# Patient Record
Sex: Female | Born: 1995
Health system: Southern US, Community
[De-identification: ages and names within clinical notes are randomized; demographics above are authoritative.]

## PROBLEM LIST (undated history)

## (undated) DIAGNOSIS — F329 Major depressive disorder, single episode, unspecified: Secondary | ICD-10-CM

## (undated) DIAGNOSIS — T7840XA Allergy, unspecified, initial encounter: Secondary | ICD-10-CM

## (undated) DIAGNOSIS — J45909 Unspecified asthma, uncomplicated: Secondary | ICD-10-CM

## (undated) DIAGNOSIS — B019 Varicella without complication: Secondary | ICD-10-CM

## (undated) DIAGNOSIS — F419 Anxiety disorder, unspecified: Secondary | ICD-10-CM

## (undated) DIAGNOSIS — Z8619 Personal history of other infectious and parasitic diseases: Secondary | ICD-10-CM

## (undated) DIAGNOSIS — F509 Eating disorder, unspecified: Secondary | ICD-10-CM

## (undated) DIAGNOSIS — F32A Depression, unspecified: Secondary | ICD-10-CM

## (undated) DIAGNOSIS — I639 Cerebral infarction, unspecified: Secondary | ICD-10-CM

## (undated) HISTORY — DX: Allergy, unspecified, initial encounter: T78.40XA

## (undated) HISTORY — PX: TONSILLECTOMY AND ADENOIDECTOMY: SUR1326

## (undated) HISTORY — DX: Varicella without complication: B01.9

## (undated) HISTORY — DX: Personal history of other infectious and parasitic diseases: Z86.19

## (undated) HISTORY — PX: WISDOM TOOTH EXTRACTION: SHX21

---

## 2016-02-19 ENCOUNTER — Encounter (HOSPITAL_COMMUNITY): Payer: Self-pay | Admitting: Emergency Medicine

## 2016-02-19 ENCOUNTER — Emergency Department (HOSPITAL_COMMUNITY)
Admission: EM | Admit: 2016-02-19 | Discharge: 2016-02-19 | Disposition: A | Discharge status: Home / Self Care | Payer: BC Managed Care – PPO | Attending: Emergency Medicine | Admitting: Emergency Medicine

## 2016-02-19 DIAGNOSIS — Z79899 Other long term (current) drug therapy: Secondary | ICD-10-CM | POA: Insufficient documentation

## 2016-02-19 DIAGNOSIS — J45909 Unspecified asthma, uncomplicated: Secondary | ICD-10-CM | POA: Insufficient documentation

## 2016-02-19 DIAGNOSIS — F32A Depression, unspecified: Secondary | ICD-10-CM

## 2016-02-19 DIAGNOSIS — F329 Major depressive disorder, single episode, unspecified: Secondary | ICD-10-CM | POA: Diagnosis not present

## 2016-02-19 DIAGNOSIS — Z046 Encounter for general psychiatric examination, requested by authority: Secondary | ICD-10-CM | POA: Diagnosis present

## 2016-02-19 DIAGNOSIS — F509 Eating disorder, unspecified: Secondary | ICD-10-CM | POA: Insufficient documentation

## 2016-02-19 HISTORY — DX: Cerebral infarction, unspecified: I63.9

## 2016-02-19 HISTORY — DX: Unspecified asthma, uncomplicated: J45.909

## 2016-02-19 HISTORY — DX: Eating disorder, unspecified: F50.9

## 2016-02-19 HISTORY — DX: Anxiety disorder, unspecified: F41.9

## 2016-02-19 HISTORY — DX: Depression, unspecified: F32.A

## 2016-02-19 HISTORY — DX: Major depressive disorder, single episode, unspecified: F32.9

## 2016-02-19 LAB — COMPREHENSIVE METABOLIC PANEL
ALBUMIN: 5.2 g/dL — AB (ref 3.5–5.0)
ALK PHOS: 51 U/L (ref 38–126)
ALT: 16 U/L (ref 14–54)
AST: 22 U/L (ref 15–41)
Anion gap: 8 (ref 5–15)
BILIRUBIN TOTAL: 1.1 mg/dL (ref 0.3–1.2)
BUN: 19 mg/dL (ref 6–20)
CALCIUM: 9.7 mg/dL (ref 8.9–10.3)
CO2: 25 mmol/L (ref 22–32)
CREATININE: 0.86 mg/dL (ref 0.44–1.00)
Chloride: 107 mmol/L (ref 101–111)
GFR calc Af Amer: >60 mL/min (ref >60)
GFR calc non Af Amer: >60 mL/min (ref >60)
GLUCOSE: 84 mg/dL (ref 65–99)
Potassium: 3.7 mmol/L (ref 3.5–5.1)
SODIUM: 140 mmol/L (ref 135–145)
Total Protein: 7.6 g/dL (ref 6.5–8.1)

## 2016-02-19 LAB — CBC WITH DIFFERENTIAL/PLATELET
Basophils Absolute: 0 10*3/uL (ref 0.0–0.1)
Basophils Relative: 0 %
Eosinophils Absolute: 0.4 10*3/uL (ref 0.0–0.7)
Eosinophils Relative: 5 %
HEMATOCRIT: 42.5 % (ref 36.0–46.0)
HEMOGLOBIN: 14.3 g/dL (ref 12.0–15.0)
LYMPHS ABS: 3.4 10*3/uL (ref 0.7–4.0)
LYMPHS PCT: 48 %
MCH: 31 pg (ref 26.0–34.0)
MCHC: 33.6 g/dL (ref 30.0–36.0)
MCV: 92.2 fL (ref 78.0–100.0)
Monocytes Absolute: 0.4 10*3/uL (ref 0.1–1.0)
Monocytes Relative: 5 %
NEUTROS ABS: 3.1 10*3/uL (ref 1.7–7.7)
NEUTROS PCT: 42 %
Platelets: 250 10*3/uL (ref 150–400)
RBC: 4.61 MIL/uL (ref 3.87–5.11)
RDW: 13.1 % (ref 11.5–15.5)
WBC: 7.2 10*3/uL (ref 4.0–10.5)

## 2016-02-19 LAB — SALICYLATE LEVEL: Salicylate Lvl: <7 mg/dL (ref 2.8–30.0)

## 2016-02-19 LAB — I-STAT BETA HCG BLOOD, ED (MC, WL, AP ONLY): I-stat hCG, quantitative: <5 m[IU]/mL (ref <5)

## 2016-02-19 LAB — ETHANOL: Alcohol, Ethyl (B): <5 mg/dL (ref <5)

## 2016-02-19 LAB — ACETAMINOPHEN LEVEL

## 2016-02-19 MED ORDER — ONDANSETRON HCL 4 MG PO TABS: 4.0000 mg | ORAL_TABLET | Freq: Three times a day (TID) | ORAL | Status: DC | PRN

## 2016-02-19 MED ORDER — ALUM & MAG HYDROXIDE-SIMETH 200-200-20 MG/5ML PO SUSP: 30.0000 mL | ORAL | Status: DC | PRN

## 2016-02-19 MED ORDER — ZOLPIDEM TARTRATE 5 MG PO TABS: 5.0000 mg | ORAL_TABLET | Freq: Every evening | ORAL | Status: DC | PRN

## 2016-02-19 MED ORDER — ACETAMINOPHEN 325 MG PO TABS: 650.0000 mg | ORAL_TABLET | ORAL | Status: DC | PRN

## 2016-02-19 MED ORDER — IBUPROFEN 200 MG PO TABS: 600.0000 mg | ORAL_TABLET | Freq: Three times a day (TID) | ORAL | Status: DC | PRN

## 2016-02-19 MED ORDER — LORAZEPAM 1 MG PO TABS: 1.0000 mg | ORAL_TABLET | Freq: Three times a day (TID) | ORAL | Status: DC | PRN

## 2016-02-19 NOTE — ED Triage Notes (Signed)
Per Mother pt has depression and anxiety and text her sister last night and told her she was going to overdose  Pt has hx of overdose at age 89 Mother states pt also has an eating disorder and has lost 75 lbs in the past 6 months  Pt was placed on Zoloft a week ago  Pt has been to a therapist for two visits and did not like it so quit going but has an appt on the 23rd  Mother states she filed an intake form with [employer] for her eating disorder but they do not offer any outpt program  Pt recently had a breakup with her boyfriend

## 2016-02-19 NOTE — BH Assessment (Addendum)
Tele Assessment Note   Amy Odom is an 20 y.o. female. Presenting voluntarily accompanied by mother and father for assessment. Pt has history of anorexia nervosa, anxiety, and depression. Pt reports anorexia is "in recovery". Pt does report recent weight loss of 75lbs within the last six months. Pt reports no menstrual cycle.   Pt and significant other of two years broke up approximately one month ago. Pt states "last night I found out that my ex-boyfriend was dating another girl and it just got me down". Pt communicated to sibling that she felt "impulsive" and "thought about overdosing" on Zoloft. Pt denies experiencing intent. Pt states "it was more like pay attention to me". Pt further explained that her mother had "blew me off" and she wanted her sister to relay the statement to mother so that she would "come pay attention to me". Pt attends [employer] and resides on campus with roommates.  Pt denies suicidal ideation at time of assessment. Parents are alarmed due to patients history of suicide attempt (20yo) via intentional ingestion of Dilantin which resulted in ICU. Pt denies any additional attempts. Pt has no history of inpatient admission.  Pt denies history of self-harm. Pt denies homicidal ideations. Pt denies hallucinations. Pt is not currently followed by any outpatient providers.  Pt's prescriptions are managed by her primary care provider.   Parents state patient is doing well in school however "she doesn't seem to find any happiness" and often seems "loss or hopeless". Mother states that she is able to ensure pt's safety in the home if discharged. Mother states pt would not be returning to campus dormitory if discharged. Father communicates he is less comfortable than mother in ability to ensure pt's safety. Pt is apprehensive of possibility of inpatient admission recommendation. .   Diagnosis: Major depressive disorder, recurrent, mild Anorexia Nervosa Generalized anxiety  disorder  Past Medical History:  Past Medical History:  Diagnosis Date  . Anxiety   . Asthma   . Depression   . Eating disorder   . Stroke [facility])     Past Surgical History:  Procedure Laterality Date  . TONSILLECTOMY AND ADENOIDECTOMY      Family History:  Family History  Problem Relation Age of Onset  . Depression Mother   . Anxiety disorder Mother   . Seizures Father   . Cancer Father     Social History:  reports that she has never smoked. She has never used smokeless tobacco. She reports that she does not drink alcohol or use drugs.  Additional Social History:  Alcohol / Drug Use Pain Medications: Pt denies abuse Prescriptions: Pt denies abuse. Pt reports compliance with medications History of alcohol / drug use?: No history of alcohol / drug abuse  CIWA: CIWA-Ar BP: 99/78 Pulse Rate: 76 COWS:    PATIENT STRENGTHS: (choose at least two) Average or above average intelligence Supportive family/friends  Allergies: No Known Allergies  Home Medications:  (Not in a hospital admission)  OB/GYN Status:  No LMP recorded. Patient is not currently having periods (Reason: Other).  General Assessment Data Location of Assessment: [facility] ED TTS Assessment: In system Is this a Tele or Face-to-Face Assessment?: Face-to-Face Is this an Initial Assessment or a Re-assessment for this encounter?: Initial Assessment Marital status: Single Is patient pregnant?: Unknown Pregnancy Status: Unknown Living Arrangements: Non-relatives/Friends (On Campus) Can pt return to current living arrangement?: Yes Admission Status: Voluntary Is patient capable of signing voluntary admission?: Yes Referral Source: Self/Family/Friend Insurance type: BCBS  Crisis Care Plan Living Arrangements: Non-relatives/Friends (On Campus) Name of Psychiatrist: None Name of Therapist: None  Education Status Is patient currently in school?: Yes Current Grade: [employer] Highest grade of school  patient has completed: Freshmen Name of [facility] [employer] Contact person: None Indentified  Risk to self with the past 6 months Suicidal Ideation: Yes-Currently Present Has patient been a risk to self within the past 6 months prior to admission? : No Suicidal Intent: No Has patient had any suicidal intent within the past 6 months prior to admission? : No Is patient at risk for suicide?: Yes Suicidal Plan?: No-Not Currently/Within Last 6 Months Has patient had any suicidal plan within the past 6 months prior to admission? : Yes (Overdosing on Zoloft) Access to Means: Yes Specify Access to Suicidal Means: Access to Zoloft What has been your use of drugs/alcohol within the last 12 months?: 4 Previous Attempts/Gestures: Yes How many times?: 1 (age 20) Other Self Harm Risks: h/o suicide attempt Triggers for Past Attempts: Other (Comment) (Depression) Intentional Self Injurious Behavior: None Family Suicide History: Yes (grandfather) Recent stressful life event(s): Other (Comment) (recovering from eating disorder) Persecutory voices/beliefs?: No Depression: Yes Depression Symptoms: Fatigue, Guilt (to eating disorder) Substance abuse history and/or treatment for substance abuse?: No Suicide prevention information given to non-admitted patients: Yes  Risk to Others within the past 6 months Homicidal Ideation: No Does patient have any lifetime risk of violence toward others beyond the six months prior to admission? : No Thoughts of Harm to Others: No Current Homicidal Intent: No Current Homicidal Plan: No Access to Homicidal Means: No History of harm to others?: No Assessment of Violence: None Noted Does patient have access to weapons?: No Criminal Charges Pending?: No Does patient have a court date: No Is patient on probation?: No  Psychosis Hallucinations: None noted Delusions: None noted  Mental Status Report Appearance/Hygiene: In scrubs Eye Contact: Good Motor Activity:  Unremarkable Speech: Soft, Logical/coherent Level of Consciousness: Alert, Quiet/awake Mood: Sad Affect: Constricted Anxiety Level: Minimal Thought Processes: Coherent, Relevant Judgement: Unimpaired Orientation: Person, Place, Time, Situation Obsessive Compulsive Thoughts/Behaviors: None  Cognitive Functioning Concentration: Normal Memory: Recent Intact, Remote Intact IQ: Average Insight: Good Impulse Control: Fair Appetite: Fair (Per pt report) Weight Loss: 75 (within last six months) Weight Gain: 0 Sleep: No Change Total Hours of Sleep: 7 Vegetative Symptoms: None  ADLScreening [facility]) Patient's cognitive ability adequate to safely complete daily activities?: Yes Patient able to express need for assistance with ADLs?: Yes Independently performs ADLs?: Yes (appropriate for developmental age)  Prior Inpatient Therapy Prior Inpatient Therapy: No  Prior Outpatient Therapy Prior Outpatient Therapy: Yes Prior Therapy Dates: 3 months ago Prior Therapy Facilty/Provider(s): [facility] Reason for Treatment: depression and eating disorder Does patient have an ACCT team?: No Does patient have Intensive In-House Services?  : No Does patient have [facility] services? : No Does patient have P4CC services?: No  ADL Screening (condition at time of admission) Patient's cognitive ability adequate to safely complete daily activities?: Yes Is the patient deaf or have difficulty hearing?: No Does the patient have difficulty concentrating, remembering, or making decisions?: No Patient able to express need for assistance with ADLs?: Yes Does the patient have difficulty dressing or bathing?: No Independently performs ADLs?: Yes (appropriate for developmental age) Does the patient have difficulty walking or climbing stairs?: No Weakness of Legs: None Weakness of Arms/Hands: None  Home Assistive Devices/Equipment Home Assistive Devices/Equipment:  None  Therapy Consults (therapy consults require a  physician order) PT Evaluation Needed: No OT Evalulation Needed: No SLP Evaluation Needed: No Abuse/Neglect Assessment (Assessment to be complete while patient is alone) Physical Abuse: Denies Verbal Abuse: Denies Sexual Abuse: Denies Exploitation of patient/patient's resources: Denies Self-Neglect: Denies Values / Beliefs Cultural Requests During Hospitalization: None Spiritual Requests During Hospitalization: None Consults Spiritual Care Consult Needed: No Social Work Consult Needed: No Merchant navy officer (For Healthcare) Does patient have an advance directive?: No Would patient like information on creating an advanced directive?: No - patient declined information    Additional Information 1:1 In Past 12 Months?: No CIRT Risk: No Elopement Risk: No Does patient have medical clearance?: No     Disposition: Clinician consulted with Donell Sievert, PA and pt is not recommended for inpatient admission. Clinician provided and discussed with pt and family intensive outpatient and OPT resources including [facility]. Clinician also provided pt and family with information for [facility] mobile crisis. Lanae Crumbly, PA informed of and agrees with pt disposition.  Disposition Initial Assessment Completed for this Encounter: Yes Disposition of Patient: Other dispositions Other disposition(s): Other (Comment) (Pending Psychiatric Recommendation)  Paxtyn Boyar J [place] 02/19/2016 8:47 PM

## 2016-02-19 NOTE — ED Notes (Signed)
Bed: WTR6 Expected date:  Expected time:  Means of arrival:  Comments: 

## 2016-02-19 NOTE — Discharge Instructions (Signed)
Refer to resource guide for further outpatient assistance. Return here for any new/worsening symptoms (ie-- suicidal or homicidal ideation, hallucinations, etc).

## 2016-02-19 NOTE — ED Provider Notes (Signed)
[facility]-EMERGENCY DEPT Provider Note   CSN: [id number] Arrival date & time: 02/19/16  1821     History   Chief Complaint Chief Complaint  Patient presents with  . Suicidal    HPI Amy Odom is a 20 y.o. female.  The history is provided by the patient and medical records.    20 year old female with history of anxiety, asthma, depression, anorexia nervosa, presenting to the ED for psychiatric evaluation. For the past 11 months or so patient has been battling with an eating disorder.  States notably this began last Christmas-- refused to eat any holiday treats or other "extras".  Mother states she has lost approximately 75 pounds in the past 6 months. Patient recently saw a counselor, however did not like the counselor so she has decided not to go back. She states now she feels that she is eating more and make sure that she gets "enough calories to sustain me". She continues to restrict her calories, will not eat anything without a label or number of calories visible. Patient does report she has been withdrawing from her friends and family because of this, mostly she is concerned about eating in public. States she does get some type of joy from knowing that she can control her caloric intake and her weight.  Apparently last night, she was upset over a recent breakup with her boyfriend after finding out he now has a new girlfriend and texted at her sister and said she was going to overdose on medications. States she did not take any medications. This concerned her mother as she has a history of overdose at the age of 20. She took an overdose of Dilantin and ended up intubated in the ICU for 1 week because of this. She was never admitted to a psychiatric facility following this.  Patient denies any current suicidal or homicidal ideation.  No auditory or visual hallucinations.  Denies recent drug or alcohol abuse.  Patient has been taking zoloft for her depression for the past week or so-- states not  sure if it is helping, is causing some nausea.    Past Medical History:  Diagnosis Date  . Anxiety   . Asthma   . Depression   . Eating disorder   . Stroke [facility](HCC)     There are no active problems to display for this patient.   Past Surgical History:  Procedure Laterality Date  . TONSILLECTOMY AND ADENOIDECTOMY      OB History    No data available       Home Medications    Prior to Admission medications   Medication Sig Start Date End Date Taking? Authorizing Provider  Multiple Vitamin (MULTIVITAMIN WITH MINERALS) TABS tablet Take 1 tablet by mouth daily.   Yes Historical Provider, MD  sertraline (ZOLOFT) 25 MG tablet Take 25 mg by mouth daily.   Yes Historical Provider, MD    Family History Family History  Problem Relation Age of Onset  . Depression Mother   . Anxiety disorder Mother   . Seizures Father   . Cancer Father     Social History Social History  Substance Use Topics  . Smoking status: Never Smoker  . Smokeless tobacco: Never Used  . Alcohol use No     Allergies   Review of patient's allergies indicates no known allergies.   Review of Systems Review of Systems  Psychiatric/Behavioral: Positive for behavioral problems.  All other systems reviewed and are negative.    Physical Exam Updated  Vital Signs BP 99/78 (BP Location: Left Arm)   Pulse 76   Temp 97.6 F (36.4 C) (Oral)   Resp 16   Ht 5\' 10"  (1.778 m)   Wt 56.2 kg   SpO2 99%   BMI 17.79 kg/m   Physical Exam  Constitutional: She is oriented to person, place, and time. She appears well-developed and well-nourished.  Thin but not cachectic  HENT:  Head: Normocephalic and atraumatic.  Mouth/Throat: Oropharynx is clear and moist.  Eyes: Conjunctivae and EOM are normal. Pupils are equal, round, and reactive to light.  Neck: Normal range of motion.  Cardiovascular: Normal rate, regular rhythm and normal heart sounds.   Pulmonary/Chest: Effort normal and breath sounds normal.    Abdominal: Soft. Bowel sounds are normal.  Musculoskeletal: Normal range of motion.  Neurological: She is alert and oriented to person, place, and time.  Skin: Skin is warm and dry.  Psychiatric: She has a normal mood and affect. She is not actively hallucinating. She expresses no homicidal and no suicidal ideation. She expresses no suicidal plans and no homicidal plans.  Denies SI/HI/AVH  Nursing note and vitals reviewed.    ED Treatments / Results  Labs (all labs ordered are listed, but only abnormal results are displayed) Labs Reviewed  COMPREHENSIVE METABOLIC PANEL - Abnormal; Notable for the following:       Result Value   Albumin 5.2 (*)    All other components within normal limits  ETHANOL  SALICYLATE LEVEL  ACETAMINOPHEN LEVEL  CBC WITH DIFFERENTIAL/PLATELET  RAPID URINE DRUG SCREEN, HOSP PERFORMED  I-STAT BETA HCG BLOOD, ED ([facility], [facility], [facility] ONLY)    EKG  EKG Interpretation None       Radiology No results found.  Procedures Procedures (including critical care time)  Medications Ordered in ED Medications - No data to display   Initial Impression / Assessment and Plan / ED Course  I have reviewed the triage vital signs and the nursing notes.  Pertinent labs & imaging results that were available during my care of the patient were reviewed by me and considered in my medical decision making (see chart for details).  Clinical Course   20 y.o. F here with mom for psychiatric evaluation.  She has known hx of depression as well as anorexia nervosa and told sister she wanted to OD on meds last night after discovering her ex boyfriend is now dating someone else.  Denies taking any pills or other medications last night.  No drugs or alcohol.  Denies feelings of SI/HI or AVH currently.  Does have hx of OD in the past.  Lab work reassuring.  TTS consult pending.  9:51 PM TTS has evaluated patient.  Feels she is safe for discharge with outpatient resources.  See their note  for full details.  Given resources regarding programs about eating disorders.  D/c home in care of mom with strict return precautions for any new/worsening symptoms-- specifically SI/HI/AVH.  Final Clinical Impressions(s) / ED Diagnoses   Final diagnoses:  Eating disorder  Depression, unspecified depression type    New Prescriptions Discharge Medication List as of 02/19/2016 10:33 PM       Garlon Hatchet, PA-C 02/19/16 2320    Alvira Monday, MD 02/24/16 1719

## 2016-03-04 ENCOUNTER — Emergency Department (HOSPITAL_COMMUNITY)
Admission: EM | Admit: 2016-03-04 | Discharge: 2016-03-04 | Disposition: A | Discharge status: Home / Self Care | Payer: BC Managed Care – PPO | Attending: Emergency Medicine | Admitting: Emergency Medicine

## 2016-03-04 ENCOUNTER — Encounter (HOSPITAL_COMMUNITY): Payer: Self-pay

## 2016-03-04 ENCOUNTER — Emergency Department (HOSPITAL_COMMUNITY): Payer: BC Managed Care – PPO

## 2016-03-04 DIAGNOSIS — F5001 Anorexia nervosa, restricting type: Secondary | ICD-10-CM | POA: Diagnosis not present

## 2016-03-04 DIAGNOSIS — R1033 Periumbilical pain: Secondary | ICD-10-CM | POA: Insufficient documentation

## 2016-03-04 DIAGNOSIS — Z8673 Personal history of transient ischemic attack (TIA), and cerebral infarction without residual deficits: Secondary | ICD-10-CM | POA: Diagnosis not present

## 2016-03-04 DIAGNOSIS — R42 Dizziness and giddiness: Secondary | ICD-10-CM | POA: Diagnosis present

## 2016-03-04 DIAGNOSIS — R001 Bradycardia, unspecified: Secondary | ICD-10-CM | POA: Diagnosis not present

## 2016-03-04 DIAGNOSIS — J45909 Unspecified asthma, uncomplicated: Secondary | ICD-10-CM | POA: Diagnosis not present

## 2016-03-04 DIAGNOSIS — R109 Unspecified abdominal pain: Secondary | ICD-10-CM

## 2016-03-04 LAB — URINALYSIS, ROUTINE W REFLEX MICROSCOPIC
Bilirubin Urine: NEGATIVE
Glucose, UA: NEGATIVE mg/dL
Hgb urine dipstick: NEGATIVE
Ketones, ur: NEGATIVE mg/dL
Leukocytes, UA: NEGATIVE
Nitrite: NEGATIVE
Protein, ur: NEGATIVE mg/dL
Specific Gravity, Urine: 1.018 (ref 1.005–1.030)
pH: 7.5 (ref 5.0–8.0)

## 2016-03-04 LAB — COMPREHENSIVE METABOLIC PANEL
ALT: 17 U/L (ref 14–54)
AST: 22 U/L (ref 15–41)
Albumin: 4.9 g/dL (ref 3.5–5.0)
Alkaline Phosphatase: 47 U/L (ref 38–126)
Anion gap: 6 (ref 5–15)
BUN: 17 mg/dL (ref 6–20)
CO2: 24 mmol/L (ref 22–32)
Calcium: 9.3 mg/dL (ref 8.9–10.3)
Chloride: 111 mmol/L (ref 101–111)
Creatinine, Ser: 0.83 mg/dL (ref 0.44–1.00)
GFR calc Af Amer: >60 mL/min (ref >60)
GFR calc non Af Amer: >60 mL/min (ref >60)
Glucose, Bld: 81 mg/dL (ref 65–99)
Potassium: 3.8 mmol/L (ref 3.5–5.1)
Sodium: 141 mmol/L (ref 135–145)
Total Bilirubin: 1.2 mg/dL (ref 0.3–1.2)
Total Protein: 7.1 g/dL (ref 6.5–8.1)

## 2016-03-04 LAB — LIPASE, BLOOD: Lipase: 44 U/L (ref 11–51)

## 2016-03-04 LAB — URINE MICROSCOPIC-ADD ON
Bacteria, UA: NONE SEEN
RBC / HPF: NONE SEEN RBC/hpf (ref 0–5)
SQUAMOUS EPITHELIAL / LPF: NONE SEEN
WBC UA: NONE SEEN WBC/hpf (ref 0–5)

## 2016-03-04 LAB — MAGNESIUM: Magnesium: 2.4 mg/dL (ref 1.7–2.4)

## 2016-03-04 LAB — CBC
HCT: 40.5 % (ref 36.0–46.0)
Hemoglobin: 13.7 g/dL (ref 12.0–15.0)
MCH: 31.4 pg (ref 26.0–34.0)
MCHC: 33.8 g/dL (ref 30.0–36.0)
MCV: 92.9 fL (ref 78.0–100.0)
Platelets: 227 10*3/uL (ref 150–400)
RBC: 4.36 MIL/uL (ref 3.87–5.11)
RDW: 13.2 % (ref 11.5–15.5)
WBC: 7.4 10*3/uL (ref 4.0–10.5)

## 2016-03-04 LAB — HCG, QUANTITATIVE, PREGNANCY: hCG, Beta Chain, Quant, S: <1 m[IU]/mL (ref <5)

## 2016-03-04 LAB — PHOSPHORUS: Phosphorus: 2.7 mg/dL (ref 2.5–4.6)

## 2016-03-04 MED ORDER — METOCLOPRAMIDE HCL 5 MG/ML IJ SOLN
5.0000 mg | Freq: Once | INTRAMUSCULAR | Status: AC
  Administered 2016-03-04: 5 mg via INTRAVENOUS
  Filled 2016-03-04: qty 2

## 2016-03-04 MED ORDER — DICYCLOMINE HCL 10 MG PO CAPS
10.0000 mg | ORAL_CAPSULE | Freq: Once | ORAL | Status: AC
  Administered 2016-03-04: 10 mg via ORAL
  Filled 2016-03-04: qty 1

## 2016-03-04 MED ORDER — SODIUM CHLORIDE 0.9 % IV BOLUS (SEPSIS)
1000.0000 mL | Freq: Once | INTRAVENOUS | Status: AC
  Administered 2016-03-04: 1000 mL via INTRAVENOUS

## 2016-03-04 MED ORDER — METOCLOPRAMIDE HCL 5 MG PO TABS: 2.5000 mg | ORAL_TABLET | Freq: Three times a day (TID) | ORAL | 0 refills | Status: DC | PRN

## 2016-03-04 NOTE — ED Notes (Signed)
[employer] at bedside

## 2016-03-04 NOTE — ED Provider Notes (Signed)
[facility]-EMERGENCY DEPT Provider Note   CSN: [id number] Arrival date & time: 03/04/16  1331     History   Chief Complaint Chief Complaint  Patient presents with  . Eating Disorder    HPI Amy Odom is a 20 y.o. female.  HPI   20yF with anorexia nervosa. Has been an issue from last ~1y. Last night after swim practice she felt generally weak. Lightheaded as if she may pass out. She binge ate because she assumed it was because she hasn't been eating well.  She began having significant epigastric/periumbilical pain shortly later. She felt full to the point that she felt like she needed to vomit. She did not try to self induce this. She reports intermittent abdominal pain for the past couple weeks which is worse post prandially. Associated nausea. COntinued to feel tired/weak today. Reports this to nutritionist she just established with and was advised to seek medical evaluation. She has also had one counseling session, but did not feel comfortable with counselor.   Past Medical History:  Diagnosis Date  . Anxiety   . Asthma   . Depression   . Eating disorder   . Stroke [facility])     There are no active problems to display for this patient.   Past Surgical History:  Procedure Laterality Date  . TONSILLECTOMY AND ADENOIDECTOMY      OB History    No data available       Home Medications    Prior to Admission medications   Medication Sig Start Date End Date Taking? Authorizing Provider  Multiple Vitamin (MULTIVITAMIN WITH MINERALS) TABS tablet Take 1 tablet by mouth daily.    Historical Provider, MD  sertraline (ZOLOFT) 25 MG tablet Take 25 mg by mouth daily.    Historical Provider, MD    Family History Family History  Problem Relation Age of Onset  . Depression Mother   . Anxiety disorder Mother   . Seizures Father   . Cancer Father     Social History Social History  Substance Use Topics  . Smoking status: Never Smoker  . Smokeless tobacco: Never Used  .  Alcohol use No     Allergies   Review of patient's allergies indicates no known allergies.   Review of Systems Review of Systems  All systems reviewed and negative, other than as noted in HPI.   Physical Exam Updated Vital Signs BP 107/63 (BP Location: Right Arm)   Pulse 84   Temp 97.5 F (36.4 C) (Oral)   Resp 22   Ht 5\' 10"  (1.778 m)   Wt 120 lb (54.4 kg)   SpO2 98%   BMI 17.22 kg/m   Physical Exam  Constitutional: She appears well-developed and well-nourished. No distress.  HENT:  Head: Normocephalic and atraumatic.  Eyes: Conjunctivae are normal. Right eye exhibits no discharge. Left eye exhibits no discharge.  Neck: Neck supple.  Cardiovascular: Normal rate, regular rhythm and normal heart sounds.  Exam reveals no gallop and no friction rub.   No murmur heard. Pulmonary/Chest: Effort normal and breath sounds normal. No respiratory distress.  Abdominal: Soft. She exhibits no distension. There is no tenderness.  Musculoskeletal: She exhibits no edema or tenderness.  Neurological: She is alert.  Skin: Skin is warm and dry.  Psychiatric: She has a normal mood and affect. Her behavior is normal. Thought content normal.  Nursing note and vitals reviewed.    ED Treatments / Results  Labs (all labs ordered are listed, but only abnormal  results are displayed) Labs Reviewed  URINALYSIS, ROUTINE W REFLEX MICROSCOPIC (NOT AT [facility]) - Abnormal; Notable for the following:       Result Value   APPearance TURBID (*)    All other components within normal limits  LIPASE, BLOOD  COMPREHENSIVE METABOLIC PANEL  CBC  HCG, QUANTITATIVE, PREGNANCY  PHOSPHORUS  MAGNESIUM  URINE MICROSCOPIC-ADD ON    EKG  EKG Interpretation None       Radiology US Abdomen Complete  Result Date: 03/04/2016 CLINICAL DATA:  Epigastric pain since this morning EXAM: ABDOMEN ULTRASOUND COMPLETE COMPARISON:  None. FINDINGS: Gallbladder: No gallstones or wall thickening visualized. No  sonographic Murphy sign noted by sonographer. Common bile duct: Diameter: 2.6 mm Liver: No focal lesion identified. Within normal limits in parenchymal echogenicity. IVC: No abnormality visualized. Pancreas: Visualized portion unremarkable. Spleen: Size and appearance within normal limits. Right Kidney: Length: 11.5 cm. Echogenicity within normal limits. No mass or hydronephrosis visualized. Left Kidney: Length: 11.1 cm. Echogenicity within normal limits. No mass or hydronephrosis visualized. Abdominal aorta: No aneurysm visualized. Other findings: None. IMPRESSION: No acute intra-abdominal pathology. Electronically Signed   By: Jolaine Click M.D.   On: 03/04/2016 16:57    Procedures Procedures (including critical care time)  Medications Ordered in ED Medications - No data to display   Initial Impression / Assessment and Plan / ED Course  I have reviewed the triage vital signs and the nursing notes.  Pertinent labs & imaging results that were available during my care of the patient were reviewed by me and considered in my medical decision making (see chart for details).  Clinical Course    20yF with anorexia nervosa. Near syncopal symptoms. This could be from a multitude of different reasons. Family is concerned about refeeding syndrome although clinically I doubt this.   She is bradycardic. Bradycardia is common findings in anorexic patients and is typically compensatory from chronically reduced caloric intake. She could be potentially symptomatic from it. Somewhat reassuring though that she doesn't seem to be symptomatic with exertion such as when she swims. Also consider significant electrolyte derangement. Her QRS is somewhat widened at 111 ms, but intervals are otherwise ok. Hypovolemia/dehydration could be contributing.   Abdominal pain could be from several different reasons as well. I suspect it's probably from  gastroparesis with hx of anorexia and she reports symptoms worsened shortly  after eating, feeling of bloating, fullness, etc. Gastritis, PUD, pancreatitis and biliary colic are all also considerations but I feel they are less likely.  This is just not a situation which is ideally addressed in the ED. We will certainly check her electrolytes. Will obtain RUQ [place] to evaluate her GB. I can certainly start her empirically on a H2 blocker for possible dyspepsia or metoclopramide for possible gastroparesis.  I think seeing a nutritionist is a good idea, but I think counseling is the primary intervention here. Parents are understandably frustrated. Unless someone is acutely psychotic, suicidal or homicidal the ER is just not adequate to address this. I can certainly give her a list of counseling resources, but I'm not sure which would be best specifically for eating disorders.   5:22 PM US unremarkable. Labs normal. Discussed with psych NP for names of some outpt providers who specifically do work with people with eating disorders. Will also proscribe low dose of reglan to try PRN particularly before meals. It has been determined that no acute conditions requiring further emergency intervention are present at this time. The patient has been advised  of the diagnosis and plan. I reviewed any labs and imaging including any potential incidental findings. We have discussed signs and symptoms that warrant return to the ED and they are listed in the discharge instructions.    Final Clinical Impressions(s) / ED Diagnoses   Final diagnoses:  Anorexia nervosa, restricting type  Abdominal pain, unspecified abdominal location  Bradycardia    New Prescriptions New Prescriptions   No medications on file     Raeford RazorStephen Dorsel Flinn, MD 03/04/16 1724

## 2016-03-04 NOTE — ED Notes (Signed)
PT AND FAMILY MADE AWARE OF RHE NEED FOR A URINE SAMPLE.

## 2016-03-04 NOTE — ED Triage Notes (Signed)
PT BROUGHT IN BY PARENTS FOR ANOREXIA RELATED SYMPTOMS. PT SUFFERS FROM ANOREXIA BUT ALSO HAD AN EPISODE OF BINGE EATING LAST NIGHT. PT WAS SEEN BY A DOCTOR TODAY AND THEY WERE TOLD TO BRING HER IN FOR IMMEDIATE LAB WORK. PT C/O ABDOMINAL PAIN.

## 2016-09-05 ENCOUNTER — Ambulatory Visit (HOSPITAL_COMMUNITY)
Admission: EM | Admit: 2016-09-05 | Discharge: 2016-09-05 | Disposition: A | Discharge status: Nursing Home (Includes ICF) | Payer: 59

## 2016-09-05 ENCOUNTER — Encounter (HOSPITAL_COMMUNITY): Payer: Self-pay | Admitting: Family Medicine
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
## 2017-01-19 DIAGNOSIS — N3000 Acute cystitis without hematuria: Secondary | ICD-10-CM | POA: Diagnosis not present

## 2017-01-20 ENCOUNTER — Encounter: Payer: Self-pay | Admitting: Family Medicine

## 2017-01-20 ENCOUNTER — Ambulatory Visit (INDEPENDENT_AMBULATORY_CARE_PROVIDER_SITE_OTHER): Payer: 59 | Admitting: Family Medicine

## 2017-01-20 VITALS — BP 118/70 | HR 68 | Resp 12 | Ht 70.02 in | Wt 160.5 lb

## 2017-01-20 DIAGNOSIS — F5000 Anorexia nervosa, unspecified: Secondary | ICD-10-CM | POA: Diagnosis not present

## 2017-01-20 DIAGNOSIS — R41840 Attention and concentration deficit: Secondary | ICD-10-CM

## 2017-01-20 DIAGNOSIS — F339 Major depressive disorder, recurrent, unspecified: Secondary | ICD-10-CM | POA: Diagnosis not present

## 2017-01-20 MED ORDER — BUPROPION HCL ER (SR) 100 MG PO TB12: 100.0000 mg | ORAL_TABLET | Freq: Two times a day (BID) | ORAL | 0 refills | Status: DC

## 2017-01-20 NOTE — Progress Notes (Signed)
HPI:   [name] is a 21 y.o. female, who is here today to establish care.  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 Last preventive routine visit: 1-2 years ago.  Chronic medical problems: Depression, anorexia,secondary amenorrhea. Asthma during childhood, seasonal allergies.   Concerns today: she is requesting a prescription for Adderall to help with concentration.  She denies history of ADHD or ADD. She is reporting "concentration problems" for about 2 years. States that she has trouble paying attention at school, completing her homework, and keeping a conversation in a social setting. She doesn't think her depression is causing these symptoms.  She sleeps "fine", about 7-8 hours.  She last followed with psychiatrist about 6 months ago, she was supposed to follow but she did not schedule appointment. She didn't discussed and concentration issues during psychiatric visit. Currently she is not taking antidepressant medication. She tried Lexapro about 2 years ago but discontinued because it didn't help. She has history of suicidal attempts, hospitalized in 2012.  According to patient, her anorexia is not longer an active problem. Started gaining wt 6-7 months ago and her weight has been stable for the past 4 months. She exercises about 4-5 times per week: 30 minutes of cardio and 30 minutes of weightlifting.  LMP 2.5 years ago.  Reporting lab work done about 5-6 months ago. She is sexually active and uses condoms consistently.  She lives with her parents.   Review of Systems  Constitutional: Negative for activity change, appetite change, fatigue and unexpected weight change.  HENT: Negative for mouth sores, nosebleeds, sore throat and trouble swallowing.   Eyes: Negative for redness and visual disturbance.  Respiratory: Negative for chest tightness, shortness of breath and wheezing.   Cardiovascular: Negative for chest pain, palpitations and leg swelling.    Gastrointestinal: Negative for abdominal pain, nausea and vomiting.       No changes in bowel habits.  Endocrine: Negative for cold intolerance and heat intolerance.  Genitourinary: Positive for menstrual problem. Negative for decreased urine volume, dysuria and vaginal bleeding.  Musculoskeletal: Negative for gait problem and myalgias.  Skin: Negative for rash.  Allergic/Immunologic: Positive for environmental allergies.  Neurological: Negative for dizziness, syncope, weakness and headaches.  Hematological: Negative for adenopathy. Does not bruise/bleed easily.  Psychiatric/Behavioral: Positive for decreased concentration. Negative for confusion, hallucinations, self-injury, sleep disturbance and suicidal ideas. The patient is not nervous/anxious.      No current outpatient prescriptions on file prior to visit.   No current facility-administered medications on file prior to visit.      Past Medical History:  Diagnosis Date  . Allergy   . Anxiety   . Asthma   . Chicken pox   . Depression   . Eating disorder   . Stroke [facility])    Related to head trauma at age 26   No Known Allergies  Family History  Problem Relation Age of Onset  . Depression Mother   . Anxiety disorder Mother   . Alcohol abuse Mother   . Seizures Father   . Cancer Father   . Cancer Maternal Uncle        Lung  . Alcohol abuse Maternal Grandmother   . Cancer Maternal Grandmother        colon    Social History   Social History  . Marital status: Single    Spouse name: N/A  . Number of children: N/A  . Years of education: N/A   Social History Main  Topics  . Smoking status: Never Smoker  . Smokeless tobacco: Never Used  . Alcohol use Yes     Comment: seldom  . Drug use: No  . Sexual activity: Yes    Birth control/ protection: Condom   Other Topics Concern  . None   Social History Narrative  . None    Vitals:   01/20/17 1131  BP: 118/70  Pulse: 68  Resp: 12  SpO2: 98%    Body  mass index is 23.02 kg/m.   Physical Exam  Nursing note and vitals reviewed. Constitutional: She is oriented to person, place, and time. She appears well-developed and well-nourished. No distress.  HENT:  Head: Normocephalic and atraumatic.  Mouth/Throat: Oropharynx is clear and moist and mucous membranes are normal.  Eyes: Pupils are equal, round, and reactive to light. Conjunctivae and EOM are normal.  Neck: No tracheal deviation present. No thyroid mass and no thyromegaly present.  Cardiovascular: Normal rate and regular rhythm.   No murmur heard. Pulses:      Dorsalis pedis pulses are 2+ on the right side, and 2+ on the left side.  Respiratory: Effort normal and breath sounds normal. No respiratory distress.  GI: Soft. She exhibits no mass. There is no hepatomegaly. There is no tenderness.  Musculoskeletal: She exhibits no edema.  Lymphadenopathy:    She has no cervical adenopathy.  Neurological: She is alert and oriented to person, place, and time. She has normal strength. Coordination and gait normal.  Skin: Skin is warm. No erythema.  Psychiatric:  Flat affect. Well groomed, poor eye contact.    ASSESSMENT AND PLAN:   Amy Odom was seen today for establish care.  Diagnoses and all orders for this visit:  Disturbed concentration  We discussed possible causes, including other psychiatric disorders like depression or anxiety. We also discussed some of the treatment options and diagnostic criteria for ADD/ADHD. I am strongly recommending following with her psychiatrist, who can evaluate and treat accordingly. Explained that my concern about medications as Adderall it that could exacerbate wt loss.  She agrees with trying Wellbutrin, we discussed side effects.  -     buPROPion (WELLBUTRIN SR) 100 MG 12 hr tablet; Take 1 tablet (100 mg total) by mouth 2 (two) times daily.  Recurrent major depressive disorder, remission status unspecified (HCC)  This is still an active  problem and can be causing/aggravating her concentration problems. I strongly recommend scheduling a follow-up appointment with her psychiatrist. We discussed some side effects of Wellbutrin, including suicidal thoughts and wt loss given her history. I will see her back in 4 weeks if she cannot is scheduled an appointment with her psychiatrist by then.  -     buPROPion (WELLBUTRIN SR) 100 MG 12 hr tablet; Take 1 tablet (100 mg total) by mouth 2 (two) times daily.  Anorexia nervosa in remission  She is reporting problem is well controlled. She is not doing psychotherapy currently. She will continue following with psychiatrist.     Jannae Fagerstrom G. [place], MD  [facility]. [place] office.

## 2017-01-20 NOTE — Patient Instructions (Signed)
A few things to remember from today's visit:   Disturbed concentration - Plan: buPROPion (WELLBUTRIN SR) 100 MG 12 hr tablet  Recurrent major depressive disorder, remission status unspecified (HCC)  Please follow with your psychiatrist. If you cannot see your provider within the next 4 weeks, I will see her back in 4 weeks. If any suicidal thoughts, stop medication and seek immediate medical attention. Caution with this medication, it can decrease appetite.  Please be sure medication list is accurate. If a new problem present, please set up appointment sooner than planned today.

## 2017-02-20 ENCOUNTER — Ambulatory Visit: Payer: 59 | Admitting: Family Medicine

## 2017-04-07 ENCOUNTER — Ambulatory Visit: Payer: Self-pay | Admitting: *Deleted

## 2017-04-07 ENCOUNTER — Telehealth: Payer: Self-pay | Admitting: Family Medicine

## 2017-04-07 NOTE — Telephone Encounter (Signed)
[facility], and is complaining of burning with urination and blood noticed in urine; pt is also experiencing chills;pt triaged per nurse protocol; pt states that she "unable to be seen today because she has class and  requests to be seen tomorrow 04/08/17 at 1230 because she has work in the morning"; [facility]; pt offered and agreed to appointment at 0845 04/08/17 with Dr Clent RidgesFry; pt verbalizes understanding; will route to [facility] pool to notify them of upcoming appointment; see CRM # [id number].  Reason for Disposition . Blood in urine (red, pink, or tea-colored)  Answer Assessment - Initial Assessment Questions 1. SEVERITY: "How bad is the pain?"  (e.g., Scale 1-10; mild, moderate, or severe)   - MILD (1-3): complains slightly about urination hurting   - MODERATE (4-7): interferes with normal activities     - SEVERE (8-10): excruciating, unwilling or unable to urinate because of the pain      Moderate 7 out of 10 2. FREQUENCY: "How many times have you had painful urination today?"      Everytime; voided 4 times today 3. PATTERN: "Is pain present every time you urinate or just sometimes?"      yes 4. ONSET: "When did the painful urination start?"      04/05/17 5. FEVER: "Do you have a fever?" If so, ask: "What is your temperature, how was it measured, and when did it start?"     No temp has not been taken 6. PAST UTI: "Have you had a urine infection before?" If so, ask: "When was the last time?" and "What happened that time?"      Yes; last UTI 6-7 months ago 7. CAUSE: "What do you think is causing the painful urination?"  (e.g., UTI, scratch, Herpes sore)     UTI 8. OTHER SYMPTOMS: "Do you have any other symptoms?" (e.g., flank pain, vaginal discharge, genital sores, urgency, blood in urine)     Blood in urine, chills 9. PREGNANCY: "Is there any chance you are pregnant?" "When was your last menstrual period?"     No, LMP  middle of November 2018  Protocols used: URINATION PAIN [facility]- FEMALE-A-AH

## 2017-04-07 NOTE — Telephone Encounter (Signed)
Copied from CRM [phone]. Topic: Inquiry >> Apr 07, 2017 10:39 AM Windy KalataMichael, Taylor L, NT wrote: Reason for CRM: pt is calling and is requesting a antibiotic sent in to her pharmacy for a UTI.

## 2017-04-07 NOTE — Telephone Encounter (Signed)
Please schedule acute visit appt for urinary symptoms.  Thanks, BJ

## 2017-04-08 ENCOUNTER — Encounter: Payer: Self-pay | Admitting: Family Medicine

## 2017-04-08 ENCOUNTER — Ambulatory Visit (INDEPENDENT_AMBULATORY_CARE_PROVIDER_SITE_OTHER): Payer: 59 | Admitting: Family Medicine

## 2017-04-08 ENCOUNTER — Ambulatory Visit: Payer: 59 | Admitting: Family Medicine

## 2017-04-08 VITALS — BP 110/78 | HR 68 | Temp 97.7°F | Ht 70.02 in | Wt 162.4 lb

## 2017-04-08 DIAGNOSIS — R319 Hematuria, unspecified: Secondary | ICD-10-CM

## 2017-04-08 DIAGNOSIS — N390 Urinary tract infection, site not specified: Secondary | ICD-10-CM

## 2017-04-08 DIAGNOSIS — R350 Frequency of micturition: Secondary | ICD-10-CM

## 2017-04-08 LAB — POCT URINALYSIS DIPSTICK
BILIRUBIN UA: NEGATIVE
GLUCOSE UA: NEGATIVE
Ketones, UA: NEGATIVE
NITRITE UA: NEGATIVE
Protein, UA: NEGATIVE
Spec Grav, UA: 1.01 (ref 1.010–1.025)
Urobilinogen, UA: 0.2 E.U./dL
pH, UA: 7.5 (ref 5.0–8.0)

## 2017-04-08 MED ORDER — NITROFURANTOIN MONOHYD MACRO 100 MG PO CAPS: 100.0000 mg | ORAL_CAPSULE | Freq: Two times a day (BID) | ORAL | 0 refills | Status: AC

## 2017-04-08 NOTE — Telephone Encounter (Signed)
Patient has an appointment today at 4pm  

## 2017-04-08 NOTE — Progress Notes (Signed)
HPI:  Chief Complaint  Patient presents with  . Dysuria  . Hematuria    2 DAYS AGO  . Fatigue    Amy Odom is a 21 y.o. female, who is here today complaining of 2 of urinary symptoms.   Dysuria: Yes,burning sensation. Urinary frequency: Yes Urinary urgency: Yes Incontinence: Denies Gross hematuria: one episode when symptoms just started. She denies history of nephrolithiasis, she is not sure about prior episodes of gross hematuria. Abdominal pain: Denies   Nausea or vomiting: Denies  Abnormal vaginal bleeding or discharge: Denies  LMP: 03/26/17 Sexual activity: Yes, no more than usual. Hx of UTI: Yes, last UTI about 2-3 months ago. A day before urinary symptoms onset, she "held urine all day", she thinks this contributed to UTI symptoms.  She has been evaluated by urologist. According to patient she was evaluated about 2 months ago and work-up was "fine."  UTIs seem to be exacerbated by sexual intercourse.  OTC medications for this problem: Cranberry pills.   Review of Systems  Constitutional: Positive for chills and fatigue. Negative for activity change, appetite change and fever.  Cardiovascular: Negative for leg swelling.  Gastrointestinal: Negative for abdominal pain, nausea and vomiting.       No changes in bowel habits.  Genitourinary: Positive for dysuria, frequency, hematuria and urgency. Negative for decreased urine volume, vaginal bleeding and vaginal discharge.  Musculoskeletal: Negative for back pain and myalgias.  Hematological: Negative for adenopathy. Does not bruise/bleed easily.  Psychiatric/Behavioral: Negative for confusion. The patient is nervous/anxious.       No current outpatient medications on file prior to visit.   No current facility-administered medications on file prior to visit.      Past Medical History:  Diagnosis Date  . Allergy   . Anxiety   . Asthma   . Chicken pox   . Depression   . Eating disorder     . Stroke [facility](HCC)    Related to head trauma at age 89   No Known Allergies  Social History   Socioeconomic History  . Marital status: Single    Spouse name: None  . Number of children: None  . Years of education: None  . Highest education level: None  Social Needs  . Financial resource strain: None  . Food insecurity - worry: None  . Food insecurity - inability: None  . Transportation needs - medical: None  . Transportation needs - non-medical: None  Occupational History  . None  Tobacco Use  . Smoking status: Never Smoker  . Smokeless tobacco: Never Used  Substance and Sexual Activity  . Alcohol use: Yes    Comment: seldom  . Drug use: No  . Sexual activity: Yes    Birth control/protection: Condom  Other Topics Concern  . None  Social History Narrative  . None    Vitals:   04/08/17 1609  BP: 110/78  Pulse: 68  Temp: 97.7 F (36.5 C)  SpO2: 100%   Body mass index is 23.29 kg/m.   Physical Exam  Nursing note and vitals reviewed. Constitutional: She is oriented to person, place, and time. She appears well-developed. No distress.  HENT:  Head: Normocephalic and atraumatic.  Mouth/Throat: Oropharynx is clear and moist and mucous membranes are normal.  Eyes: Conjunctivae are normal.  Cardiovascular: Normal rate and regular rhythm.  Respiratory: Effort normal and breath sounds normal. No respiratory distress.  GI: Soft. She exhibits no mass. There is no tenderness. There is  no CVA tenderness.  Musculoskeletal: She exhibits no edema.  Neurological: She is alert and oriented to person, place, and time. She has normal strength. Gait normal.  Skin: Skin is warm. No erythema.  Psychiatric: Her mood appears anxious.  Well-groomed, good eye contact.    ASSESSMENT AND PLAN:   Amy Odom was seen today for dysuria, hematuria and fatigue.  Diagnoses and all orders for this visit:  Frequent urination  Urine dipstick today abnormal. Urine sent for culture.  -      POCT urinalysis dipstick -     Urine culture  Urinary tract infection with hematuria, site unspecified  Empiric abx treatment started today and will be tailored according to Ucx results and susceptibility report.  Clearly instructed about warning signs. F/U if symptoms persist.  We may need to consider prophylactic nitrofurantoin after sexual intercourse.   -     Urine culture -     nitrofurantoin, macrocrystal-monohydrate, (MACROBID) 100 MG capsule; Take 1 capsule (100 mg total) by mouth 2 (two) times daily for 5 days.    -Amy Flatterynna L Klima was advised to return or notify a doctor immediately if symptoms worsen or persist or new concerns arise.      Betty G. [place], MD  [facility]. [place] office.

## 2017-04-08 NOTE — Telephone Encounter (Signed)
Pt called back yesterday and cancelled her appt

## 2017-04-08 NOTE — Patient Instructions (Addendum)
A few things to remember from today's visit:   Frequent urination - Plan: POCT urinalysis dipstick, Urine culture  Urinary tract infection with hematuria, site unspecified - Plan: Urine culture    Adequate fluid intake, avoid holding urine for long hours, and over the counter Vit C OR cranberry capsules might help.  Today we will treat empirically with antibiotic, which we might need to change when urine culture comes back depending of bacteria susceptibility.  Seek immediate medical attention if severe abdominal pain, vomiting, fever/chills, or worsening symptoms. F/U if symptomatic are not any better after 2-3 days of antibiotic treatment.  Please be sure medication list is accurate. If a new problem present, please set up appointment sooner than planned today.

## 2017-04-09 LAB — URINE CULTURE
MICRO NUMBER:: 81336503
RESULT: NO GROWTH
SPECIMEN QUALITY:: ADEQUATE

## 2017-09-15 ENCOUNTER — Ambulatory Visit (INDEPENDENT_AMBULATORY_CARE_PROVIDER_SITE_OTHER): Payer: No Typology Code available for payment source | Admitting: Family Medicine

## 2017-09-15 ENCOUNTER — Encounter: Payer: Self-pay | Admitting: *Deleted

## 2017-09-15 ENCOUNTER — Encounter: Payer: Self-pay | Admitting: Family Medicine

## 2017-09-15 VITALS — BP 110/78 | HR 100 | Temp 97.7°F | Resp 12 | Ht 70.02 in | Wt 173.0 lb

## 2017-09-15 DIAGNOSIS — H6980 Other specified disorders of Eustachian tube, unspecified ear: Secondary | ICD-10-CM | POA: Diagnosis not present

## 2017-09-15 DIAGNOSIS — J4520 Mild intermittent asthma, uncomplicated: Secondary | ICD-10-CM | POA: Diagnosis not present

## 2017-09-15 DIAGNOSIS — J069 Acute upper respiratory infection, unspecified: Secondary | ICD-10-CM | POA: Diagnosis not present

## 2017-09-15 MED ORDER — BENZONATATE 100 MG PO CAPS: 200.0000 mg | ORAL_CAPSULE | Freq: Two times a day (BID) | ORAL | 0 refills | Status: AC | PRN

## 2017-09-15 MED ORDER — FLUTICASONE PROPIONATE 50 MCG/ACT NA SUSP: 1.0000 | Freq: Two times a day (BID) | NASAL | 3 refills | Status: DC

## 2017-09-15 MED ORDER — PREDNISONE 20 MG PO TABS: 40.0000 mg | ORAL_TABLET | Freq: Every day | ORAL | 0 refills | Status: AC

## 2017-09-15 MED ORDER — ALBUTEROL SULFATE 108 (90 BASE) MCG/ACT IN AEPB: 2.0000 | INHALATION_SPRAY | Freq: Four times a day (QID) | RESPIRATORY_TRACT | 0 refills | Status: DC | PRN
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
## 2017-09-15 NOTE — Patient Instructions (Addendum)
A few things to remember from today's visit:   URI, acute - Plan: benzonatate (TESSALON) 100 MG capsule, fluticasone (FLONASE) 50 MCG/ACT nasal spray  Mild intermittent reactive airway disease without complication - Plan: predniSONE (DELTASONE) 20 MG tablet, Albuterol Sulfate (PROAIR RESPICLICK) 108 (90 Base) MCG/ACT AEPB  Dysfunction of Eustachian tube, unspecified laterality viral infections are self-limited and we treat each symptom depending of severity.  Over the counter medications as decongestants and cold medications usually help, they need to be taken with caution if there is a history of high blood pressure or palpitations. Tylenol and/or Ibuprofen also helps with most symptoms (headache, muscle aching, fever,etc) Plenty of fluids. Honey helps with cough. Steam inhalations helps with runny nose, nasal congestion, and may prevent sinus infections. Cough and nasal congestion could last a few days and sometimes weeks.   Please follow in not any better in 1-2 weeks or if symptoms get worse.  Nasal irrigation with saline may also help.  Take prednisone with breakfast.  Albuterol inh 2 puff every 6 hours for a week then as needed for wheezing or shortness of breath.   Please be sure medication list is accurate. If a new problem present, please set up appointment sooner than planned today.

## 2017-11-04 ENCOUNTER — Other Ambulatory Visit (HOSPITAL_COMMUNITY)
Admission: RE | Admit: 2017-11-04 | Discharge: 2017-11-04 | Disposition: A | Discharge status: Home / Self Care | Payer: No Typology Code available for payment source | Source: Ambulatory Visit | Attending: Family Medicine | Admitting: Family Medicine

## 2017-11-04 ENCOUNTER — Encounter: Payer: Self-pay | Admitting: Family Medicine

## 2017-11-04 ENCOUNTER — Ambulatory Visit (INDEPENDENT_AMBULATORY_CARE_PROVIDER_SITE_OTHER): Payer: No Typology Code available for payment source | Admitting: Family Medicine

## 2017-11-04 VITALS — BP 112/76 | HR 74 | Temp 98.0°F | Resp 12 | Ht 70.02 in | Wt 178.0 lb

## 2017-11-04 DIAGNOSIS — Z113 Encounter for screening for infections with a predominantly sexual mode of transmission: Secondary | ICD-10-CM

## 2017-11-04 DIAGNOSIS — Z0000 Encounter for general adult medical examination without abnormal findings: Secondary | ICD-10-CM

## 2017-11-04 DIAGNOSIS — A749 Chlamydial infection, unspecified: Secondary | ICD-10-CM | POA: Insufficient documentation

## 2017-11-04 DIAGNOSIS — Z23 Encounter for immunization: Secondary | ICD-10-CM

## 2017-11-04 DIAGNOSIS — F339 Major depressive disorder, recurrent, unspecified: Secondary | ICD-10-CM

## 2017-11-04 NOTE — Patient Instructions (Addendum)
A few things to remember from today's visit:   Screening for STD (sexually transmitted disease) - Plan: Urine cytology ancillary only  Routine general medical examination at a health care facility  Today you have you routine preventive visit.  At least 150 minutes of moderate exercise per week, daily brisk walking for 15-30 min is a good exercise option. Healthy diet low in saturated (animal) fats and sweets and consisting of fresh fruits and vegetables, lean meats such as fish and white chicken and whole grains.  These are some of recommendations for screening depending of age and risk factors:   - Vaccines:  Tdap vaccine every 10 years. Given today.  Shingles vaccine recommended at age 21, could be given after 21 years of age but not sure about insurance coverage.   Pneumonia vaccines:  Prevnar 13 at 65 and Pneumovax at 66. Sometimes Pneumovax is giving earlier if history of smoking, lung disease,diabetes,kidney disease among some.    Screening for diabetes at age 21 and every 3 years.  Cervical cancer prevention:  Pap smear starts at 21 years of age and continues periodically until 21 years old in low risk women. Pap smear every 3 years between 1021 and 21 years old. Pap smear every 3-5 years between women 30 and older if pap smear negative and HPV screening negative.   -Breast cancer: Mammogram: There is disagreement between experts about when to start screening in low risk asymptomatic female but recent recommendations are to start screening at 3540 and not later than 21 years old , every 1-2 years and after 21 yo q 2 years. Screening is recommended until 21 years old but some women can continue screening depending of healthy issues.   Colon cancer screening: starts at 21 years old until 21 years old.  Cholesterol disorder screening at age 21 and every 3 years.  Also recommended:  1. Dental visit- Brush and floss your teeth twice daily; visit your dentist twice a  year. 2. Eye doctor- Get an eye exam at least every 2 years. 3. Helmet use- Always wear a helmet when riding a bicycle, motorcycle, rollerblading or skateboarding. 4. Safe sex- If you may be exposed to sexually transmitted infections, use a condom. 5. Seat belts- Seat belts can save your live; always wear one. 6. Smoke/Carbon Monoxide detectors- These detectors need to be installed on the appropriate level of your home. Replace batteries at least once a year. 7. Skin cancer- When out in the sun please cover up and use sunscreen 15 SPF or higher. 8. Violence- If anyone is threatening or hurting you, please tell your healthcare provider.  9. Drink alcohol in moderation- Limit alcohol intake to one drink or less per day. Never drink and drive.  Please be sure medication list is accurate. If a new problem present, please set up appointment sooner than planned today.

## 2017-11-04 NOTE — Assessment & Plan Note (Signed)
She does not think pharmacologic treatment is needed. She will continue following with psychotherapist q 2 weeks.

## 2017-11-04 NOTE — Progress Notes (Signed)
HPI:   Amy Odom is a 22 y.o. female, who is here today for her routine physical.  Last CPE: 2 years ago.  Regular exercise 3 or more time per week: 4 times per week. She rans 30 min on treadmill and 40 min strength exercises.  Following a healthy diet: Yes. She lives with her parents.  Chronic medical problems: Anxiety and past Hx of anorexia nervosa. She is currently following with psychologist.   Pap smear : Reporting pap smear a year ago,negative. Hx of STD's: Denies.  Sexually active. M:15 G:0 LMP: 10/24/17.  Condoms.   There is no immunization history on file for this patient.   She has no concerns today.     Review of Systems  Constitutional: Negative for appetite change, fatigue, fever and unexpected weight change.  HENT: Negative for dental problem, hearing loss, nosebleeds, trouble swallowing and voice change.   Eyes: Negative for redness and visual disturbance.  Respiratory: Negative for cough, shortness of breath and wheezing.   Cardiovascular: Negative for chest pain and leg swelling.  Gastrointestinal: Negative for abdominal pain, blood in stool, nausea and vomiting.       No changes in bowel habits.  Endocrine: Negative for cold intolerance, heat intolerance, polydipsia, polyphagia and polyuria.  Genitourinary: Negative for decreased urine volume, dysuria, hematuria, menstrual problem, vaginal bleeding and vaginal discharge.       No breast tenderness or nipple discharge.  Musculoskeletal: Negative for gait problem and myalgias.  Skin: Negative for rash.  Allergic/Immunologic: Positive for environmental allergies.  Neurological: Negative for syncope, weakness and headaches.  Hematological: Negative for adenopathy. Does not bruise/bleed easily.  Psychiatric/Behavioral: Negative for confusion, sleep disturbance and suicidal ideas. The patient is not nervous/anxious.   All other systems reviewed and are negative.     Current  Outpatient Medications on File Prior to Visit  Medication Sig Dispense Refill  . Albuterol Sulfate (PROAIR RESPICLICK) 108 (90 Base) MCG/ACT AEPB Inhale 2 puffs into the lungs 4 (four) times daily as needed. (Patient not taking: Reported on 11/04/2017) 1 each 0  . fluticasone (FLONASE) 50 MCG/ACT nasal spray Place 1 spray into both nostrils 2 (two) times daily. (Patient not taking: Reported on 11/04/2017) 16 g 3   No current facility-administered medications on file prior to visit.      Past Medical History:  Diagnosis Date  . Allergy   . Anxiety   . Asthma   . Chicken pox   . Depression   . Eating disorder   . Stroke [facility](HCC)    Related to head trauma at age 89    Past Surgical History:  Procedure Laterality Date  . TONSILLECTOMY AND ADENOIDECTOMY      No Known Allergies  Family History  Problem Relation Age of Onset  . Depression Mother   . Anxiety disorder Mother   . Alcohol abuse Mother   . Seizures Father   . Cancer Father   . Cancer Maternal Uncle        Lung  . Alcohol abuse Maternal Grandmother   . Cancer Maternal Grandmother        colon    Social History   Socioeconomic History  . Marital status: Single    Spouse name: Not on file  . Number of children: Not on file  . Years of education: Not on file  . Highest education level: Not on file  Occupational History  . Not on file  Social Needs  . Financial  resource strain: Not on file  . Food insecurity:    Worry: Not on file    Inability: Not on file  . Transportation needs:    Medical: Not on file    Non-medical: Not on file  Tobacco Use  . Smoking status: Never Smoker  . Smokeless tobacco: Never Used  Substance and Sexual Activity  . Alcohol use: Yes    Comment: seldom  . Drug use: No  . Sexual activity: Yes    Birth control/protection: Condom  Lifestyle  . Physical activity:    Days per week: Not on file    Minutes per session: Not on file  . Stress: Not on file  Relationships  . Social  connections:    Talks on phone: Not on file    Gets together: Not on file    Attends religious service: Not on file    Active member of club or organization: Not on file    Attends meetings of clubs or organizations: Not on file    Relationship status: Not on file  Other Topics Concern  . Not on file  Social History Narrative  . Not on file     Vitals:   11/04/17 1429  BP: 112/76  Pulse: 74  Resp: 12  Temp: 98 F (36.7 C)  SpO2: 99%   Body mass index is 25.53 kg/m.   Wt Readings from Last 3 Encounters:  11/04/17 178 lb (80.7 kg)  09/15/17 173 lb (78.5 kg)  04/08/17 162 lb 6 oz (73.7 kg)    Physical Exam  Nursing note and vitals reviewed. Constitutional: She is oriented to person, place, and time. She appears well-developed and well-nourished. No distress.  HENT:  Head: Normocephalic and atraumatic.  Right Ear: Hearing, tympanic membrane, external ear and ear canal normal.  Left Ear: Hearing, tympanic membrane, external ear and ear canal normal.  Mouth/Throat: Uvula is midline, oropharynx is clear and moist and mucous membranes are normal.  Eyes: Pupils are equal, round, and reactive to light. Conjunctivae and EOM are normal.  Neck: No tracheal deviation present. No thyromegaly present.  Cardiovascular: Normal rate and regular rhythm.  No murmur heard. Pulses:      Dorsalis pedis pulses are 2+ on the right side, and 2+ on the left side.  Respiratory: Effort normal and breath sounds normal. No respiratory distress.  GI: Soft. She exhibits no mass. There is no hepatomegaly. There is no tenderness.  Musculoskeletal: She exhibits no edema.  No major deformity or signs of synovitis appreciated.  Lymphadenopathy:    She has no cervical adenopathy.       Right: No supraclavicular adenopathy present.       Left: No supraclavicular adenopathy present.  Neurological: She is alert and oriented to person, place, and time. She has normal strength. No cranial nerve deficit.  Coordination and gait normal.  Reflex Scores:      Bicep reflexes are 2+ on the right side and 2+ on the left side.      Patellar reflexes are 2+ on the right side and 2+ on the left side. Skin: Skin is warm. No rash noted. No erythema.  Psychiatric: She has a normal mood and affect. Her speech is normal.  Well groomed, good eye contact.      ASSESSMENT AND PLAN:   Amy Odom was here today annual physical examination.    Orders Placed This Encounter  Procedures  . Tdap vaccine greater than or equal to 7yo IM  Routine general medical examination at a health care facility  We discussed the importance of regular physical activity and healthy diet for prevention of chronic illness and/or complications. Preventive guidelines reviewed. Vaccination updated.  Next CPE in 1-3 years.  Screening for STD (sexually transmitted disease) -     Urine cytology ancillary only  Need for Tdap vaccination -     Tdap vaccine greater than or equal to 7yo IM   Depression, major, recurrent (HCC) She does not think pharmacologic treatment is needed. She will continue following with psychotherapist q 2 weeks.     Return in 1 year (on 11/05/2018) for CPE.     Safia Panzer G. [place], MD  [facility]. [place] office.

## 2017-11-06 ENCOUNTER — Other Ambulatory Visit: Payer: Self-pay | Admitting: Family Medicine

## 2017-11-06 DIAGNOSIS — A5602 Chlamydial vulvovaginitis: Secondary | ICD-10-CM

## 2017-11-06 LAB — URINE CYTOLOGY ANCILLARY ONLY
Chlamydia: POSITIVE — AB
Neisseria Gonorrhea: NEGATIVE
Trichomonas: NEGATIVE

## 2017-11-06 MED ORDER — AZITHROMYCIN 500 MG PO TABS: 1000.0000 mg | ORAL_TABLET | Freq: Once | ORAL | 1 refills | Status: AC

## 2017-11-09 ENCOUNTER — Encounter: Payer: Self-pay | Admitting: Family Medicine

## 2017-11-09 ENCOUNTER — Ambulatory Visit (INDEPENDENT_AMBULATORY_CARE_PROVIDER_SITE_OTHER): Payer: No Typology Code available for payment source | Admitting: Family Medicine

## 2017-11-09 VITALS — BP 120/70 | HR 75 | Temp 97.8°F | Resp 12 | Ht 70.02 in | Wt 177.5 lb

## 2017-11-09 DIAGNOSIS — N938 Other specified abnormal uterine and vaginal bleeding: Secondary | ICD-10-CM

## 2017-11-09 DIAGNOSIS — A562 Chlamydial infection of genitourinary tract, unspecified: Secondary | ICD-10-CM | POA: Diagnosis not present

## 2017-11-09 DIAGNOSIS — Z114 Encounter for screening for human immunodeficiency virus [HIV]: Secondary | ICD-10-CM

## 2017-11-09 LAB — TSH: TSH: 2.4 u[IU]/mL (ref 0.35–5.50)

## 2017-11-09 NOTE — Progress Notes (Signed)
HPI:  Chief Complaint  Patient presents with  . Discuss labs    wants HIV testing    Amy Odom is a 22 y.o. female, who is here today to discuss recent lab results.   She was seen on 11/04/17 for her CPE. Urine cytology was positive for chlamydia. She has not taken Azithromycin.  She has been sexually active with same sex partner for 6 months. No prior Hx of STI's.  States that she is not having abnormal vaginal discharge but has had intermittent spotting for a few weeks. LMP 10/24/17. Negative for fever,abdominal pain,nausea,and vomiting. Hx of urine symptoms, she wonders if chlamydia may be causing recurrent dysuria. Last Ucx 03/2017 no growth.   She also would like HIV test done.   Review of Systems  Constitutional: Negative for chills and fever.  Gastrointestinal: Negative for abdominal pain, nausea and vomiting.  Genitourinary: Positive for vaginal bleeding. Negative for dyspareunia, dysuria, genital sores, vaginal discharge and vaginal pain.  Musculoskeletal: Negative for arthralgias and joint swelling.  Skin: Negative for rash.      Current Outpatient Medications on File Prior to Visit  Medication Sig Dispense Refill  . Albuterol Sulfate (PROAIR RESPICLICK) 108 (90 Base) MCG/ACT AEPB Inhale 2 puffs into the lungs 4 (four) times daily as needed. (Patient not taking: Reported on 11/04/2017) 1 each 0  . fluticasone (FLONASE) 50 MCG/ACT nasal spray Place 1 spray into both nostrils 2 (two) times daily. (Patient not taking: Reported on 11/04/2017) 16 g 3   No current facility-administered medications on file prior to visit.      Past Medical History:  Diagnosis Date  . Allergy   . Anxiety   . Asthma   . Chicken pox   . Depression   . Eating disorder   . Stroke [facility](HCC)    Related to head trauma at age 89   No Known Allergies  Social History   Socioeconomic History  . Marital status: Single    Spouse name: Not on file  . Number of children:  Not on file  . Years of education: Not on file  . Highest education level: Not on file  Occupational History  . Not on file  Social Needs  . Financial resource strain: Not on file  . Food insecurity:    Worry: Not on file    Inability: Not on file  . Transportation needs:    Medical: Not on file    Non-medical: Not on file  Tobacco Use  . Smoking status: Never Smoker  . Smokeless tobacco: Never Used  Substance and Sexual Activity  . Alcohol use: Yes    Comment: seldom  . Drug use: No  . Sexual activity: Yes    Birth control/protection: Condom  Lifestyle  . Physical activity:    Days per week: Not on file    Minutes per session: Not on file  . Stress: Not on file  Relationships  . Social connections:    Talks on phone: Not on file    Gets together: Not on file    Attends religious service: Not on file    Active member of club or organization: Not on file    Attends meetings of clubs or organizations: Not on file    Relationship status: Not on file  Other Topics Concern  . Not on file  Social History Narrative  . Not on file    Vitals:   11/09/17 1027  BP: 120/70  Pulse:  75  Resp: 12  Temp: 97.8 F (36.6 C)  SpO2: 100%   Body mass index is 25.45 kg/m.    Physical Exam  Nursing note and vitals reviewed. Constitutional: She is oriented to person, place, and time. She appears well-developed. No distress.  HENT:  Head: Normocephalic and atraumatic.  Eyes: Conjunctivae are normal.  Respiratory: Effort normal. No respiratory distress.  Musculoskeletal: She exhibits no edema.  Neurological: She is alert and oriented to person, place, and time. She has normal strength. Gait normal.  Skin: Skin is warm. No erythema.  Psychiatric: She has a normal mood and affect.  Good eye contact,well groomed.    ASSESSMENT AND PLAN:  Amy Odom was seen today for discuss labs.  Diagnoses and all orders for this visit:  Lab Results  Component Value Date   TSH 2.40  11/09/2017    DUB (dysfunctional uterine bleeding)  Possible etiologies discussed. If problem is persistent we need to consider OCP/IUD.  -     TSH  Encounter for screening for HIV -     HIV antibody  Chlamydial infection of GU tract  We discussed symptoms and treat,ment options. She is to start Azithromycin 1 g once and refill is available for her boyfriend. Strongly recommended condom use for STD prevention.      [name] G. [place], MD  [facility]. [place] office.

## 2017-11-09 NOTE — Patient Instructions (Signed)
A few things to remember from today's visit:   Encounter for screening for HIV - Plan: HIV antibody   Chlamydia, Female Chlamydia is an STD (sexually transmitted disease). This is an infection that spreads through sexual contact. If it is not treated, it can cause serious problems. It must be treated with antibiotic medicine. Sometimes, you may not have symptoms (asymptomatic). When you have symptoms, they can include:  Burning when you pee (urinate).  Peeing often.  Fluid (discharge) coming from the vagina.  Redness, soreness, and swelling (inflammation) of the butt (rectum).  Bleeding or fluid coming from the butt.  Belly (abdominal) pain.  Pain during sex.  Bleeding between periods.  Itching, burning, or redness in the eyes.  Fluid coming from the eyes.  Follow these instructions at home: Medicines  Take over-the-counter and prescription medicines only as told by your doctor.  Take your antibiotic medicine as told by your doctor. Do not stop taking the antibiotic even if you start to feel better. Sexual activity  Tell sex partners about your infection. Sex partners are people you had oral, anal, or vaginal sex with within 60 days of when you started getting sick. They need treatment, too.  Do not have sex until: ? You and your sex partners have been treated. ? Your doctor says it is okay.  If you have a single dose treatment, wait 7 days before having sex. General instructions  It is up to you to get your test results. Ask your doctor when your results will be ready.  Get a lot of rest.  Eat healthy foods.  Drink enough fluid to keep your pee (urine) clear or pale yellow.  Keep all follow-up visits as told by your doctor. You may need tests after 3 months. Preventing chlamydia  The only way to prevent chlamydia is not to have sex. To lower your risk: ? Use latex condoms correctly. Do this every time you have sex. ? Avoid having many sex partners. ? Ask  if your partner has been tested for STDs and if he or she had negative results. Contact a doctor if:  You get new symptoms.  You do not get better with treatment.  You have a fever or chills.  You have pain during sex. Get help right away if:  Your pain gets worse and does not get better with medicine.  You get flu-like symptoms, such as: ? Night sweats. ? Sore throat. ? Muscle aches.  You feel sick to your stomach (nauseous).  You throw up (vomit).  You have trouble swallowing.  You have bleeding: ? Between periods. ? After sex.  You have irregular periods.  You have belly pain that does not get better with medicine.  You have lower back pain that does not get better with medicine.  You feel weak or dizzy.  You pass out (faint).  You are pregnant and you get symptoms of chlamydia. Summary  Chlamydia is an infection that spreads through sexual contact.  Sometimes, chlamydia can cause no symptoms (asymptomatic).  Do not have sex until your doctor says it is okay.  All sex partners will have to be treated for chlamydia. This information is not intended to replace advice given to you by your health care provider. Make sure you discuss any questions you have with your health care provider. Document Released: 02/05/2008 Document Revised: 04/17/2016 Document Reviewed: 04/17/2016 Elsevier Interactive Patient Education  2017 [employer]vier Inc.  Please be sure medication list is accurate. If a new  problem present, please set up appointment sooner than planned today.

## 2017-11-10 ENCOUNTER — Encounter: Payer: Self-pay | Admitting: Family Medicine

## 2017-11-10 LAB — HIV ANTIBODY (ROUTINE TESTING W REFLEX): HIV 1&2 Ab, 4th Generation: NONREACTIVE

## 2018-01-01 ENCOUNTER — Encounter: Payer: Self-pay | Admitting: Family Medicine

## 2018-01-01 ENCOUNTER — Other Ambulatory Visit (HOSPITAL_COMMUNITY)
Admission: RE | Admit: 2018-01-01 | Discharge: 2018-01-01 | Disposition: A | Discharge status: Home / Self Care | Payer: No Typology Code available for payment source | Source: Ambulatory Visit | Attending: Family Medicine | Admitting: Family Medicine

## 2018-01-01 ENCOUNTER — Ambulatory Visit (INDEPENDENT_AMBULATORY_CARE_PROVIDER_SITE_OTHER): Payer: No Typology Code available for payment source | Admitting: Family Medicine

## 2018-01-01 VITALS — BP 120/80 | HR 86 | Temp 98.5°F | Resp 12 | Ht 70.02 in | Wt 179.2 lb

## 2018-01-01 DIAGNOSIS — Z8619 Personal history of other infectious and parasitic diseases: Secondary | ICD-10-CM

## 2018-01-01 NOTE — Patient Instructions (Signed)
A few things to remember from today's visit:   No diagnosis found.   Please be sure medication list is accurate. If a new problem present, please set up appointment sooner than planned today.         

## 2018-01-01 NOTE — Progress Notes (Signed)
      ACUTE VISIT   HPI:  Chief Complaint  Patient presents with  . STI Check    Amy Odom is a 22 y.o. female, who is here today to be recheck for chlamydia infection. She took azithromycin 1 g on 11/05/2017. She collected sample of urine and left. She did not report symptoms.    Review of Systems    No current outpatient medications on file prior to visit.   No current facility-administered medications on file prior to visit.      Past Medical History:  Diagnosis Date  . Allergy   . Anxiety   . Asthma   . Chicken pox   . Depression   . Eating disorder   . Stroke [facility](HCC)    Related to head trauma at age 89   Allergies  Allergen Reactions  . Other     sneezing    Social History   Socioeconomic History  . Marital status: Single    Spouse name: Not on file  . Number of children: Not on file  . Years of education: Not on file  . Highest education level: Not on file  Occupational History  . Not on file  Social Needs  . Financial resource strain: Not on file  . Food insecurity:    Worry: Not on file    Inability: Not on file  . Transportation needs:    Medical: Not on file    Non-medical: Not on file  Tobacco Use  . Smoking status: Never Smoker  . Smokeless tobacco: Never Used  Substance and Sexual Activity  . Alcohol use: Yes    Comment: seldom  . Drug use: No  . Sexual activity: Yes    Birth control/protection: Condom  Lifestyle  . Physical activity:    Days per week: Not on file    Minutes per session: Not on file  . Stress: Not on file  Relationships  . Social connections:    Talks on phone: Not on file    Gets together: Not on file    Attends religious service: Not on file    Active member of club or organization: Not on file    Attends meetings of clubs or organizations: Not on file    Relationship status: Not on file  Other Topics Concern  . Not on file  Social History Narrative  . Not on file    Vitals:   01/01/18  1134  BP: 120/80  Pulse: 86  Resp: 12  Temp: 98.5 F (36.9 C)  SpO2: 99%   Body mass index is 25.71 kg/m.    Physical Exam    ASSESSMENT AND PLAN:   Amy Odom was seen today for sti check.  Diagnoses and all orders for this visit:  Hx of chlamydia infection -     Urine cytology ancillary only      Carlester Kasparek G. [place], MD  [facility]. [place] office.

## 2018-01-04 ENCOUNTER — Encounter: Payer: Self-pay | Admitting: Family Medicine

## 2018-01-04 LAB — URINE CYTOLOGY ANCILLARY ONLY: Chlamydia: NEGATIVE

## 2018-04-06 ENCOUNTER — Telehealth: Payer: No Typology Code available for payment source | Admitting: Family Medicine

## 2018-04-06 DIAGNOSIS — N390 Urinary tract infection, site not specified: Secondary | ICD-10-CM | POA: Diagnosis not present

## 2018-04-06 MED ORDER — CEPHALEXIN 500 MG PO CAPS: 500.0000 mg | ORAL_CAPSULE | Freq: Two times a day (BID) | ORAL | 0 refills | Status: AC

## 2018-04-06 NOTE — Progress Notes (Signed)

## 2018-08-13 ENCOUNTER — Encounter: Payer: Self-pay | Admitting: Family Medicine

## 2018-08-13 ENCOUNTER — Telehealth: Payer: No Typology Code available for payment source | Admitting: Family

## 2018-08-13 DIAGNOSIS — J4530 Mild persistent asthma, uncomplicated: Secondary | ICD-10-CM | POA: Diagnosis not present

## 2018-08-13 MED ORDER — PREDNISONE 20 MG PO TABS: 40.0000 mg | ORAL_TABLET | Freq: Every day | ORAL | 0 refills | Status: DC

## 2018-08-13 NOTE — Progress Notes (Signed)
E Visit for Asthma  Based on what you have shared with me, it looks like you may have a flare up of your asthma.  Asthma is a chronic (ongoing) lung disease which results in airway obstruction, inflammation and hyper-responsiveness.   Asthma symptoms vary from person to person, with common symptoms including nighttime awakening and decreased ability to participate in normal activities as a result of shortness of breath. It is often triggered by changes in weather, changes in the season, changes in air temperature, or inside (home, school, daycare or work) allergens such as animal dander, mold, mildew, woodstoves or cockroaches.   It can also be triggered by hormonal changes, extreme emotion, physical exertion or an upper respiratory tract illness.     It is important to identify the trigger, and then eliminate or avoid the trigger if possible.   If you have been prescribed medications to be taken on a regular basis, it is important to follow the asthma action plan and to follow guidelines to adjust medication in response to increasing symptoms of decreased peak expiratory flow rate  Treatment: I have prescribed: Prednisone 40mg by mouth per day for 5 - 7 days  HOME CARE . Only take medications as instructed by your medical team. . Consider wearing a mask or scarf to improve breathing air temperature have been shown to decrease irritation and decrease exacerbations . Get rest. . Taking a steamy shower or using a humidifier may help nasal congestion sand ease sore throat pain. You can place a towel over your head and breathe in the steam from hot water coming from a faucet. . Using a saline nasal spray works much the same way.  . Cough drops, hare candies and sore throat lozenges may ease your cough.  . Avoid close contacts especially the very you and the elderly . Cover your mouth if  you cough or sneeze . Always remember to wash your hands.    GET HELP RIGHT AWAY IF: . You develop worsening symptoms; breathlessness at rest, drowsy, confused or agitated, unable to speak in full sentences . You have coughing fits . You develop a severe headache or visual changes . You develop shortness of breath, difficulty breathing or start having chest pain . Your symptoms persist after you have completed your treatment plan . If your symptoms do not improve within 10 days  MAKE SURE YOU . Understand these instructions. . Will watch your condition. . Will get help right away if you are not doing well or get worse.   Your e-visit answers were reviewed by a board certified advanced clinical practitioner to complete your personal care plan, Depending upon the condition, your plan could have included both over the counter or prescription medications.  Please review your pharmacy choice. Your safety is important to us. If you have drug allergies check your prescription carefully. You can use MyChart to ask questions about today's visit, request a non-urgent call back, or ask for a work or school excuse for 24 hours related to this e-Visit. If it has been greater than 24 hours you will need to follow up with your provider, or enter a new e-Visit to address those concerns.  You will get an e-mail in the next two days asking about your experience. I hope that your e-visit has been valuable and will speed your recovery. Thank you for using e-visits. 

## 2018-08-16 IMAGING — US US ABDOMEN COMPLETE
1 series · 14 of 25 positions shown · non-contrast
Comparison: None.

CLINICAL DATA: Epigastric pain since this morning

EXAM:
ABDOMEN ULTRASOUND COMPLETE

[Series 1: us abdomen complete · 0.19mm/px · 14 of 109 slices shown]
[im 1/109]
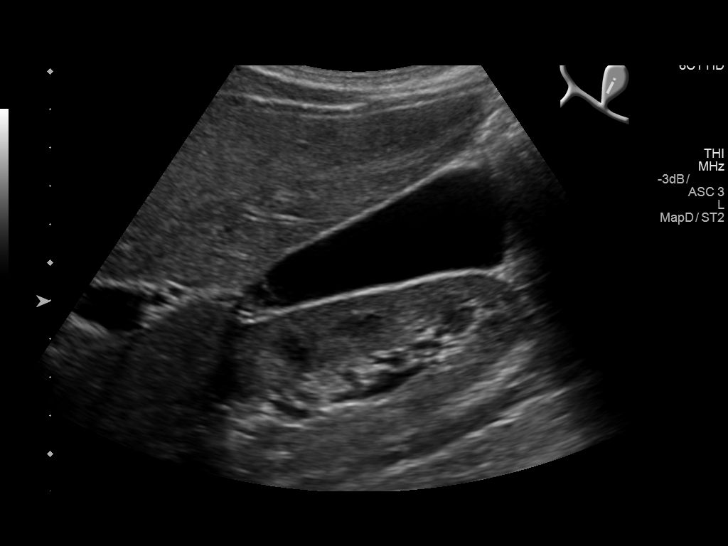
[im 10/109]
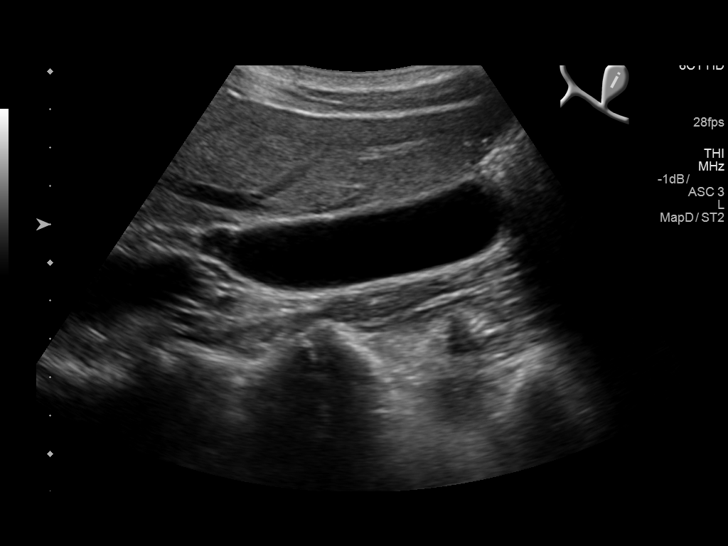
[im 19/109]
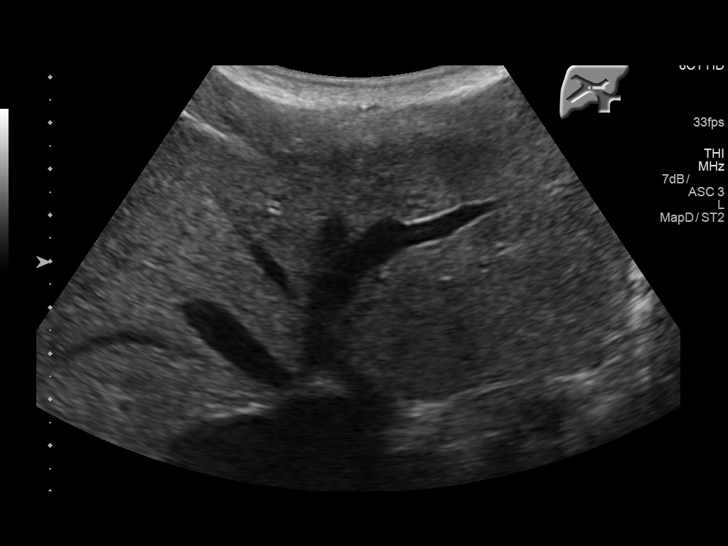
[im 28/109]
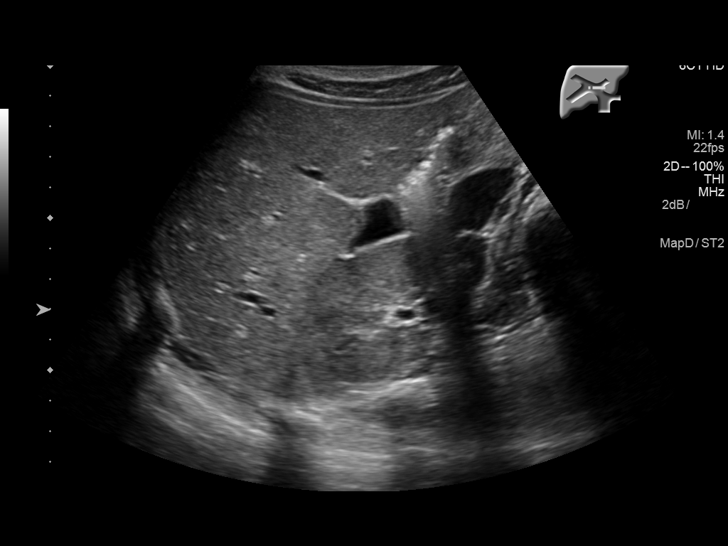
[im 37/109]
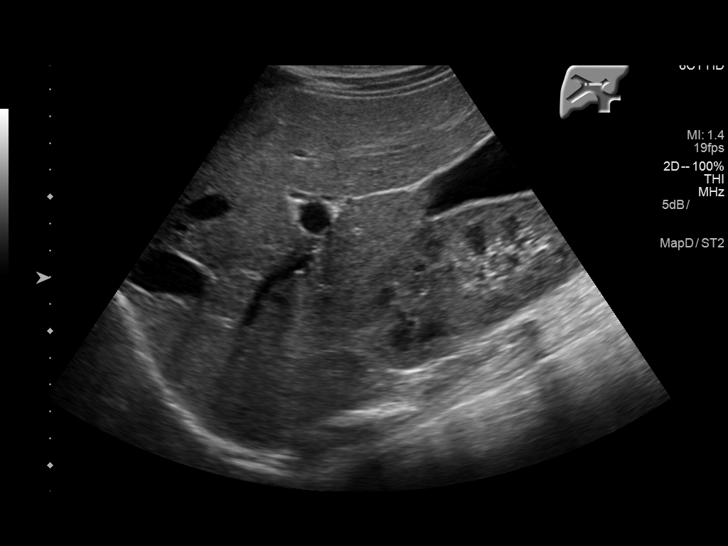
[im 41/109]
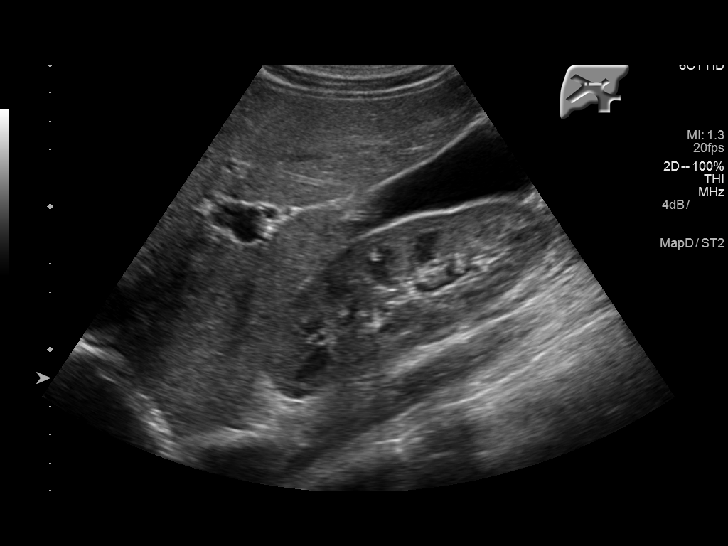
[im 50/109]
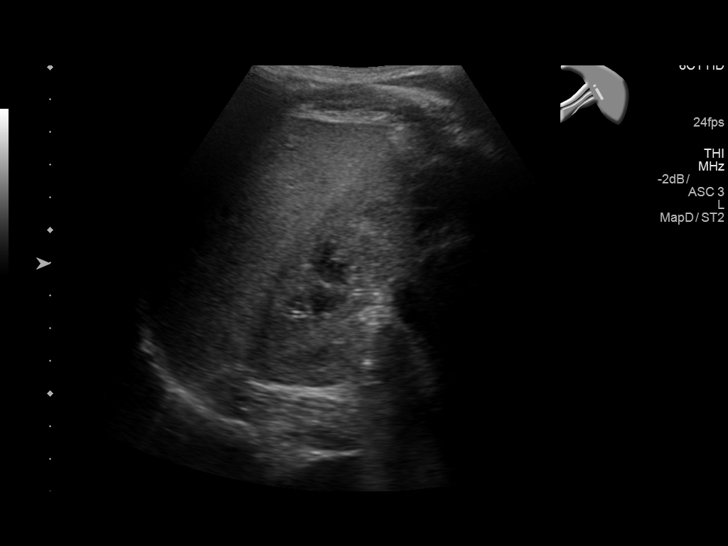
[im 59/109]
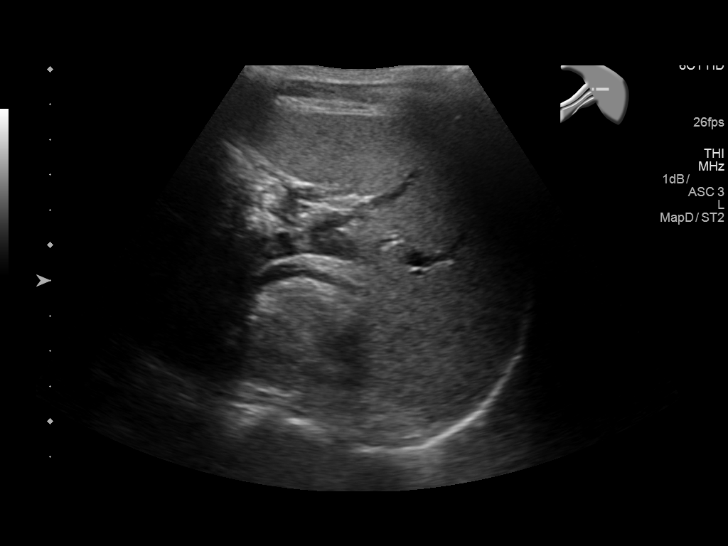
[im 68/109]
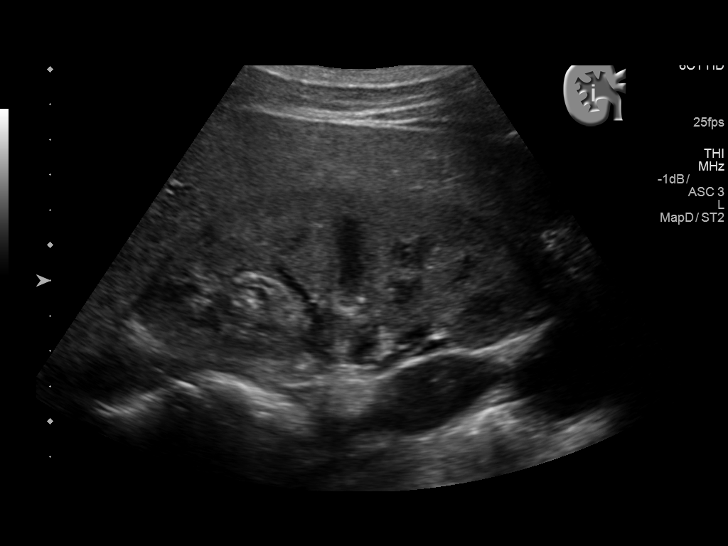
[im 73/109]
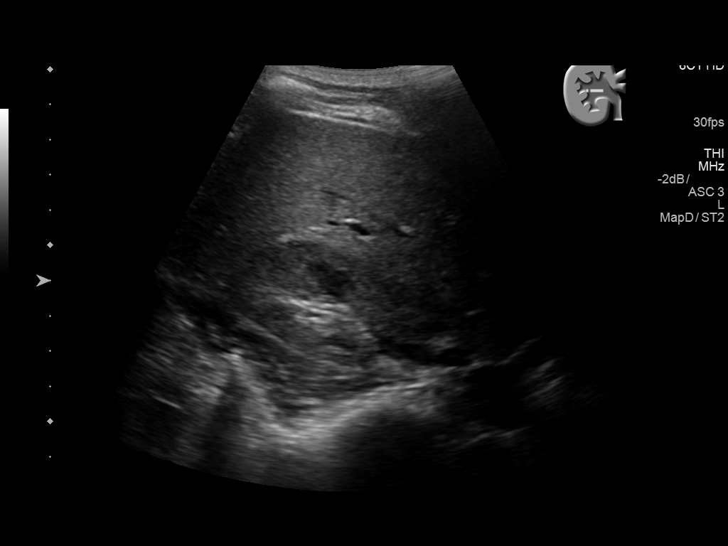
[im 82/109]
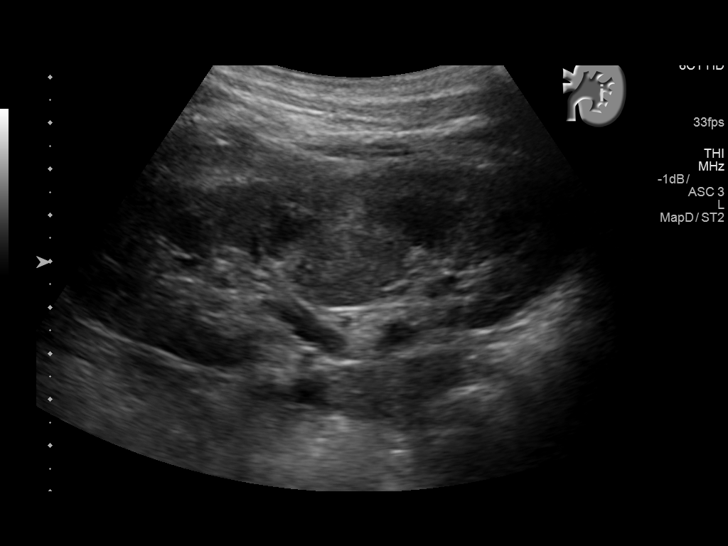
[im 91/109]
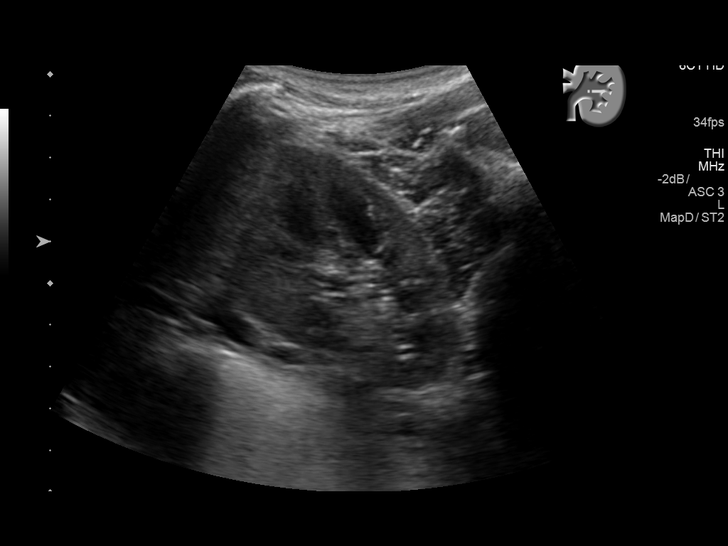
[im 100/109]
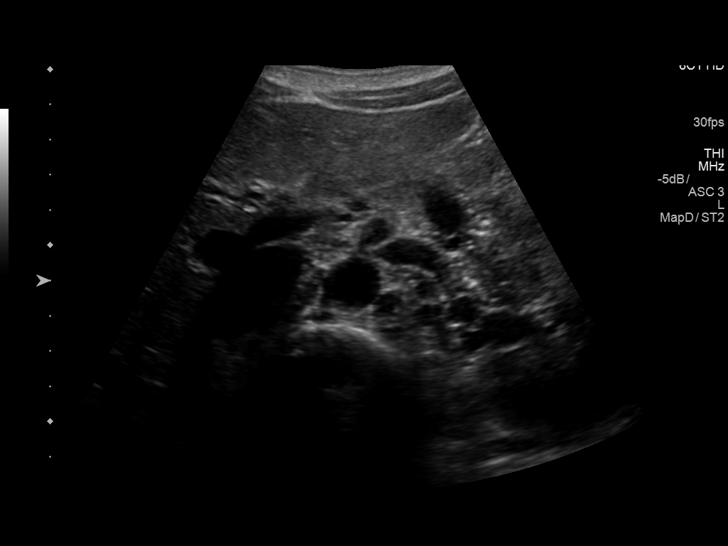
[im 109/109]
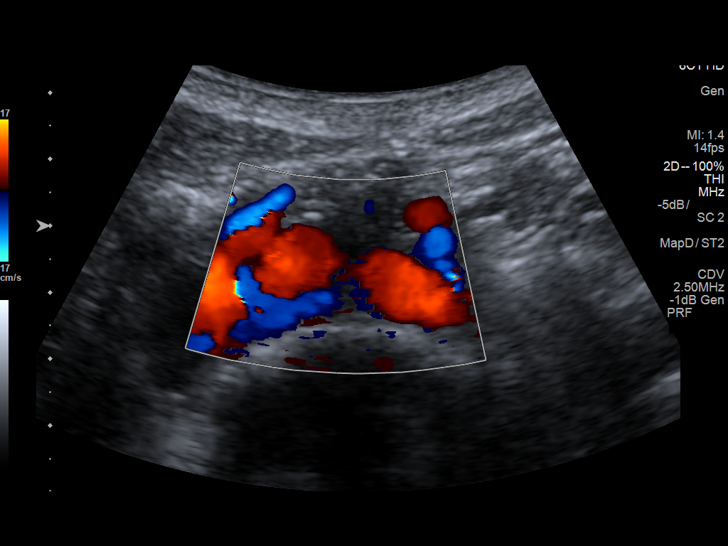

[14 of 25 positions shown; findings below may reference images not displayed]

FINDINGS: Gallbladder: No gallstones or wall thickening visualized. No
sonographic Murphy sign noted by sonographer.

Common bile duct: Diameter: 2.6 mm

Liver: No focal lesion identified. Within normal limits in
parenchymal echogenicity.

IVC: No abnormality visualized.

Pancreas: Visualized portion unremarkable.

Spleen: Size and appearance within normal limits.

Right Kidney: Length: 11.5 cm. Echogenicity within normal limits. No
mass or hydronephrosis visualized.

Left Kidney: Length: 11.1 cm. Echogenicity within normal limits. No
mass or hydronephrosis visualized.

Abdominal aorta: No aneurysm visualized.

Other findings: None.
IMPRESSION: No acute intra-abdominal pathology.

## 2018-09-06 ENCOUNTER — Encounter: Payer: Self-pay | Admitting: Family Medicine

## 2018-09-07 ENCOUNTER — Encounter: Payer: Self-pay | Admitting: Family Medicine

## 2018-09-07 ENCOUNTER — Other Ambulatory Visit: Payer: Self-pay

## 2018-09-07 ENCOUNTER — Ambulatory Visit (INDEPENDENT_AMBULATORY_CARE_PROVIDER_SITE_OTHER): Payer: No Typology Code available for payment source | Admitting: Family Medicine

## 2018-09-07 DIAGNOSIS — J45990 Exercise induced bronchospasm: Secondary | ICD-10-CM | POA: Diagnosis not present

## 2018-09-07 MED ORDER — ALBUTEROL SULFATE HFA 108 (90 BASE) MCG/ACT IN AERS: 2.0000 | INHALATION_SPRAY | Freq: Four times a day (QID) | RESPIRATORY_TRACT | 4 refills | Status: DC | PRN

## 2018-09-07 NOTE — Assessment & Plan Note (Signed)
She is reporting problem as a stable. Albuterol inhaler 2 puff 15 to 20 minutes before moderate intense exercise like running and continue 2 puff every 4-6 hours as needed. She was clearly instructed about warning signs. We will consider adding an ICS and/or Singulair if symptoms still not well controlled. Instructed about warning signs.

## 2018-09-07 NOTE — Progress Notes (Signed)
Virtual Visit via Video Note   I connected with Amy Odom on 09/07/18 at 12:00 PM EDT by a video enabled telemedicine application and verified that I am speaking with the correct person using two identifiers.  Location patient: home Location provider:work or home office Persons participating in the virtual visit: patient, provider  I discussed the limitations of evaluation and management by telemedicine and the availability of in person appointments. She expressed understanding and agreed to proceed.   HPI: Amy Odom is a 23 yo female with Hx of allergies,anxiety,and depression, who is complaining of wheezing, dyspnea, and chest tightness associated with moderate intensity exercise.  She reported history of exercise-induced asthma, usually when she runs. She runs about 5-6 times per week. Albuterol has helped with symptoms.  E-visit on 08/13/2018, reported that asthma symptoms interfering with sleep 1-3 times per week.  Treated with 5 days of prednisone.   States that problem has been stable for a while. Symptoms are exacerbated by physical activity, she has no symptoms otherwise. Alleviated by rest.  No sick contacts or recent travel. She denies associated fever, chills, change in appetite, sore throat, abdominal pain, nausea, or vomiting.  She denies having any symptom at this time.  ROS: See pertinent positives and negatives per HPI. COVID-19 screening questions: Denies known exposure to COVID-19. Negative for loss in the sense of smell or taste.   Past Medical History:  Diagnosis Date  . Allergy   . Anxiety   . Asthma   . Chicken pox   . Depression   . Eating disorder   . Stroke [facility])    Related to head trauma at age 40    Past Surgical History:  Procedure Laterality Date  . TONSILLECTOMY AND ADENOIDECTOMY      Family History  Problem Relation Age of Onset  . Depression Mother   . Anxiety disorder Mother   . Alcohol abuse Mother   . Seizures Father   . Cancer Father    . Cancer Maternal Uncle        Lung  . Alcohol abuse Maternal Grandmother   . Cancer Maternal Grandmother        colon    Social History   Socioeconomic History  . Marital status: Single    Spouse name: Not on file  . Number of children: Not on file  . Years of education: Not on file  . Highest education level: Not on file  Occupational History  . Not on file  Social Needs  . Financial resource strain: Not on file  . Food insecurity:    Worry: Not on file    Inability: Not on file  . Transportation needs:    Medical: Not on file    Non-medical: Not on file  Tobacco Use  . Smoking status: Never Smoker  . Smokeless tobacco: Never Used  Substance and Sexual Activity  . Alcohol use: Yes    Comment: seldom  . Drug use: No  . Sexual activity: Yes    Birth control/protection: Condom  Lifestyle  . Physical activity:    Days per week: Not on file    Minutes per session: Not on file  . Stress: Not on file  Relationships  . Social connections:    Talks on phone: Not on file    Gets together: Not on file    Attends religious service: Not on file    Active member of club or organization: Not on file    Attends meetings of clubs or  organizations: Not on file    Relationship status: Not on file  . Intimate partner violence:    Fear of current or ex partner: Not on file    Emotionally abused: Not on file    Physically abused: Not on file    Forced sexual activity: Not on file  Other Topics Concern  . Not on file  Social History Narrative  . Not on file      Current Outpatient Medications:  .  albuterol (VENTOLIN HFA) 108 (90 Base) MCG/ACT inhaler, Inhale 2 puffs into the lungs every 6 (six) hours as needed for wheezing or shortness of breath., Disp: 1 Inhaler, Rfl: 4  EXAM:  VITALS per patient if applicable: Resp 12   LMP 08/13/2018   GENERAL: alert, oriented, appears well and in no acute distress  HEENT: atraumatic, conjunctiva clear, normocephalic, and no  obvious facial abnormalities on inspection.  LUNGS: on inspection no signs of respiratory distress, breathing rate appears normal, no obvious gross SOB, gasping or wheezing  CV: no obvious cyanosis  PSYCH/NEURO: pleasant and cooperative, no obvious depression, she is anxious. Speech and thought processing grossly intact  ASSESSMENT AND PLAN:  Discussed the following assessment and plan:  Asthma, exercise induced She is reporting problem as a stable. Albuterol inhaler 2 puff 15 to 20 minutes before moderate intense exercise like running and continue 2 puff every 4-6 hours as needed. She was clearly instructed about warning signs. We will consider adding an ICS and/or Singulair if symptoms still not well controlled. Instructed about warning signs.    I discussed the assessment and treatment plan with the patient. She was provided an opportunity to ask questions and all were answered. She agreed with the plan and demonstrated an understanding of the instructions.   The patient was advised to call back or seek an in-person evaluation if the symptoms worsen or if the condition fails to improve as anticipated.  Return if symptoms worsen or fail to improve.    Betty SwazilandJordan, MD

## 2018-09-16 DIAGNOSIS — F411 Generalized anxiety disorder: Secondary | ICD-10-CM | POA: Diagnosis not present

## 2018-09-22 DIAGNOSIS — F411 Generalized anxiety disorder: Secondary | ICD-10-CM | POA: Diagnosis not present

## 2018-09-29 DIAGNOSIS — F411 Generalized anxiety disorder: Secondary | ICD-10-CM | POA: Diagnosis not present

## 2018-10-13 DIAGNOSIS — F411 Generalized anxiety disorder: Secondary | ICD-10-CM | POA: Diagnosis not present

## 2018-10-29 NOTE — Progress Notes (Signed)
Greater than 5 minutes, yet less than 10 minutes of time have been spent researching, coordinating, and implementing care for this patient today.  Thank you for the details you included in the comment boxes. Those details are very helpful in determining the best course of treatment for you and help us to provide the best care.  

## 2018-11-25 DIAGNOSIS — F411 Generalized anxiety disorder: Secondary | ICD-10-CM | POA: Diagnosis not present

## 2018-11-30 DIAGNOSIS — F411 Generalized anxiety disorder: Secondary | ICD-10-CM | POA: Diagnosis not present

## 2018-12-15 ENCOUNTER — Encounter: Payer: Self-pay | Admitting: Obstetrics and Gynecology

## 2018-12-28 ENCOUNTER — Other Ambulatory Visit: Payer: Self-pay

## 2018-12-31 ENCOUNTER — Ambulatory Visit: Payer: No Typology Code available for payment source | Admitting: Obstetrics and Gynecology

## 2018-12-31 ENCOUNTER — Other Ambulatory Visit (HOSPITAL_COMMUNITY)
Admission: RE | Admit: 2018-12-31 | Discharge: 2018-12-31 | Disposition: A | Discharge status: Home / Self Care | Payer: 59 | Source: Ambulatory Visit | Attending: Obstetrics and Gynecology | Admitting: Obstetrics and Gynecology

## 2018-12-31 ENCOUNTER — Encounter: Payer: Self-pay | Admitting: Obstetrics and Gynecology

## 2018-12-31 ENCOUNTER — Other Ambulatory Visit: Payer: Self-pay

## 2018-12-31 VITALS — BP 116/60 | HR 84 | Temp 97.2°F | Resp 14 | Ht 69.25 in | Wt 170.2 lb

## 2018-12-31 DIAGNOSIS — N94819 Vulvodynia, unspecified: Secondary | ICD-10-CM

## 2018-12-31 DIAGNOSIS — M6289 Other specified disorders of muscle: Secondary | ICD-10-CM | POA: Diagnosis not present

## 2018-12-31 DIAGNOSIS — Z01419 Encounter for gynecological examination (general) (routine) without abnormal findings: Secondary | ICD-10-CM | POA: Diagnosis not present

## 2018-12-31 DIAGNOSIS — Z8619 Personal history of other infectious and parasitic diseases: Secondary | ICD-10-CM | POA: Diagnosis not present

## 2018-12-31 DIAGNOSIS — Z124 Encounter for screening for malignant neoplasm of cervix: Secondary | ICD-10-CM

## 2018-12-31 DIAGNOSIS — Z113 Encounter for screening for infections with a predominantly sexual mode of transmission: Secondary | ICD-10-CM | POA: Insufficient documentation

## 2018-12-31 DIAGNOSIS — N941 Unspecified dyspareunia: Secondary | ICD-10-CM

## 2018-12-31 DIAGNOSIS — Z0000 Encounter for general adult medical examination without abnormal findings: Secondary | ICD-10-CM

## 2018-12-31 DIAGNOSIS — N946 Dysmenorrhea, unspecified: Secondary | ICD-10-CM

## 2018-12-31 MED ORDER — LIDOCAINE 5 % EX OINT: TOPICAL_OINTMENT | CUTANEOUS | 1 refills | Status: DC

## 2018-12-31 MED ORDER — NORETHIN ACE-ETH ESTRAD-FE 1-20 MG-MCG PO TABS: 1.0000 | ORAL_TABLET | Freq: Every day | ORAL | 0 refills | Status: DC

## 2018-12-31 NOTE — Progress Notes (Signed)
23 y.o. G0P0000 Single White or Caucasian Not Hispanic or Latino female here as a new patient for an annual exam. Patient complains of having pain with intercourse, severe cramping/breast tenderness with periods, and history of frequent UTIs. Per patient, no symptoms of UTI at this time.   Period Cycle (Days): 28 Period Duration (Days): 4-6 Period Pattern: Regular Menstrual Flow: Moderate, Heavy Menstrual Control: Maxi pad Menstrual Control Change Freq (Hours): 2-4 Dysmenorrhea: (!) Severe Dysmenorrhea Symptoms: Cramping, Other (Comment)("bad" back pain, breast tenderness, mood swings, and fatigue)  Cramps have always been severe.  She tried the nuvaring, had cramps. Tried OCP's x 1 didn't like it.   Sexually active since the age of 23. In HS everything was fine. All of a sudden in college she started having pain with sex, every time. More painful on entry, also with deep dyspareunia. The walls of vagina feels like it is being scrapped. No vaginal d/c. She used to have d/c in high school, now feels dry, no discharge. Only notices it when being sexually active.  She is engaged, same partner x 2 year. They are sexually active, using w/d for contraception.   She used to get frequent UTI's, recently better in the last 8-9 months.   She had 2 years of amenorrhea with eating disorder in college. Cycles restarted ~1 year ago.   Patient's last menstrual period was 12/15/2018.          Sexually active: Yes.    The current method of family planning is coitus interruptus.    Exercising: Yes.    strength training and cardio Smoker:  no  Health Maintenance: Pap:  About 2-3 years ago per patient normal History of abnormal Pap:  no TDaP:  11/04/17  Gardasil: yes, completed series   reports that she has never smoked. She has never used smokeless tobacco. She reports current alcohol use. She reports that she does not use drugs. She drinks 3-4 drinks a week. She graduated from college in May. Working  as a Systems analystpersonal trainer.   Past Medical History:  Diagnosis Date  . Allergy   . Anxiety   . Asthma   . Chicken pox   . Depression   . Eating disorder   . Stroke [facility](H[facility])    Related to head trauma at age 89  Hemorrhagic stroke, head truma.  Past Surgical History:  Procedure Laterality Date  . TONSILLECTOMY AND ADENOIDECTOMY    . WISDOM TOOTH EXTRACTION      Current Outpatient Medications  Medication Sig Dispense Refill  . albuterol (VENTOLIN HFA) 108 (90 Base) MCG/ACT inhaler Inhale 2 puffs into the lungs every 6 (six) hours as needed for wheezing or shortness of breath. 1 Inhaler 4   No current facility-administered medications for this visit.     Family History  Problem Relation Age of Onset  . Depression Mother   . Anxiety disorder Mother   . Alcohol abuse Mother   . Seizures Father   . Testicular cancer Father   . Cancer Maternal Uncle        Lung  . Alcohol abuse Maternal Grandmother   . Cancer Maternal Grandmother        colon  . Depression Sister   . Anxiety disorder Sister     Review of Systems  Constitutional: Negative.   HENT: Negative.   Eyes: Negative.   Respiratory: Negative.   Cardiovascular: Negative.   Gastrointestinal: Negative.   Endocrine: Negative.   Genitourinary:       Pain  with intercourse  Musculoskeletal: Negative.   Skin: Negative.   Allergic/Immunologic: Negative.   Neurological: Negative.   Hematological: Negative.   Psychiatric/Behavioral: Negative.     Exam:   BP 116/60 (BP Location: Right Arm, Patient Position: Sitting, Cuff Size: Normal)   Pulse 84   Temp (!) 97.2 F (36.2 C) (Temporal)   Resp 14   Ht 5' 9.25" (1.759 m)   Wt 170 lb 3.2 oz (77.2 kg)   LMP 12/15/2018   BMI 24.95 kg/m   Weight change: @WEIGHTCHANGE @ Height:   Height: 5' 9.25" (175.9 cm)  Ht Readings from Last 3 Encounters:  12/31/18 5' 9.25" (1.759 m)  01/01/18 5' 10.02" (1.779 m)  11/09/17 5' 10.02" (1.779 m)    General appearance: alert, cooperative  and appears stated age Head: Normocephalic, without obvious abnormality, atraumatic Neck: no adenopathy, supple, symmetrical, trachea midline and thyroid normal to inspection and palpation Lungs: clear to auscultation bilaterally Cardiovascular: regular rate and rhythm Breasts: normal appearance, no masses or tenderness Abdomen: soft, non-tender; non distended,  no masses,  no organomegaly Extremities: extremities normal, atraumatic, no cyanosis or edema Skin: Skin color, texture, turgor normal. No rashes or lesions Lymph nodes: Cervical, supraclavicular, and axillary nodes normal. No abnormal inguinal nodes palpated Neurologic: Grossly normal   Pelvic: External genitalia:  no lesions              Urethra:  normal appearing urethra with no masses, tenderness or lesions              Bartholins and Skenes: normal                 Vagina: normal appearing vagina with normal color and discharge, no lesions              Cervix: no lesions and mild cervical motion tenderness               Bimanual Exam:  Uterus:  normal size, contour, position, consistency, mobility, non-tender, anteverted              Adnexa: no masses, mildly tender on the right.                Rectovaginal: not tender on RV exam of uterus or adnexa               Anus:  normal sphincter tone, no lesions  Pelvic floor: tight bilaterally, tender on the right.  Chaperone was present for exam.  A:  Well Woman with normal exam  Severe dysmenorrhea  Vulvodynia  Dyspareunia, entry and deep  Pelvic floor disorder  P:   Pap with GC/CT  Screening labs, HIV and RPR  Lidocaine ointment to use with intercourse  Use lubricant with intercourse   Referral to PT  Start OCP's, no contraindications, risks reviewed  F/u in 3 months  Breast self exam reviewed  Information on calcium and vit d given

## 2018-12-31 NOTE — Patient Instructions (Addendum)
EXERCISE AND DIET:  We recommended that you start or continue a regular exercise program for good health. Regular exercise means any activity that makes your heart beat faster and makes you sweat.  We recommend exercising at least 30 minutes per day at least 3 days a week, preferably 4 or 5.  We also recommend a diet low in fat and sugar.  Inactivity, poor dietary choices and obesity can cause diabetes, heart attack, stroke, and kidney damage, among others.    ALCOHOL AND SMOKING:  Women should limit their alcohol intake to no more than 7 drinks/beers/glasses of wine (combined, not each!) per week. Moderation of alcohol intake to this level decreases your risk of breast cancer and liver damage. And of course, no recreational drugs are part of a healthy lifestyle.  And absolutely no smoking or even second hand smoke. Most people know smoking can cause heart and lung diseases, but did you know it also contributes to weakening of your bones? Aging of your skin?  Yellowing of your teeth and nails?  CALCIUM AND VITAMIN D:  Adequate intake of calcium and Vitamin D are recommended.  The recommendations for exact amounts of these supplements seem to change often, but generally speaking 1,000 Oral Contraception Information Oral contraceptive pills (OCPs) are medicines taken to prevent pregnancy. OCPs are taken by mouth, and they work by:  Preventing the ovaries from releasing eggs.  Thickening mucus in the lower part of the uterus (cervix), which prevents sperm from entering the uterus.  Thinning the lining of the uterus (endometrium), which prevents a fertilized egg from attaching to the endometrium. OCPs are highly effective when taken exactly as prescribed. However, OCPs do not prevent STIs (sexually transmitted infections). Safe sex practices, such as using condoms while on an OCP, can help prevent STIs. Before starting OCPs Before you start taking OCPs, you may have a physical exam, blood test, and Pap  test. However, you are not required to have a pelvic exam in order to be prescribed OCPs. Your health care provider will make sure you are a good candidate for oral contraception. OCPs are not a good option for certain women, including women who smoke and are older than 35 years, and women with a medical history of high blood pressure, deep vein thrombosis, pulmonary embolism, stroke, cardiovascular disease, or peripheral vascular disease. Discuss with your health care provider the possible side effects of the OCP you may be prescribed. When you start an OCP, be aware that it can take 2-3 months for your body to adjust to changes in hormone levels. Follow instructions from your health care provider about how to start taking your first cycle of OCPs. Depending on when you start the pill, you may need to use a backup form of birth control, such as condoms, during the first week. Make sure you know what steps to take if you ever forget to take the pill. Types of oral contraception  The most common types of birth control pills contain the hormones estrogen and progestin (synthetic progesterone) or progestin only. The combination pill This type of pill contains estrogen and progestin hormones. Combination pills often come in packs of 21, 28, or 91 pills. For each pack, the last 7 pills may not contain hormones, which means you may stop taking the pills for 7 days. Menstrual bleeding occurs during the week that you do not take the pills or that you take the pills with no hormones in them. The minipill This type of pill contains  the progestin hormone only. It comes in packs of 28 pills. All 28 pills contain the hormone. You take the pill every day. It is very important to take the pill at the same time each day. Advantages of oral contraceptive pills  Provides reliable and continuous contraception if taken as instructed.  May treat or decrease symptoms of: ? Menstrual period cramps. ? Irregular menstrual  cycle or bleeding. ? Heavy menstrual flow. ? Abnormal uterine bleeding. ? Acne, depending on the type of pill. ? Polycystic ovarian syndrome. ? Endometriosis. ? Iron deficiency anemia. ? Premenstrual symptoms, including premenstrual dysphoric disorder.  May reduce the risk of endometrial and ovarian cancer.  Can be used as emergency contraception.  Prevents mislocated (ectopic) pregnancies and infections of the fallopian tubes. Things that can make oral contraceptive pills less effective OCPs can be less effective if:  You forget to take the pill at the same time every day. This is especially important when taking the minipill.  You have a stomach or intestinal disease that reduces your body's ability to absorb the pill.  You take OCPs with other medicines that make OCPs less effective, such as antibiotics, certain HIV medicines, and some seizure medicines.  You take expired OCPs.  You forget to restart the pill on day 7, if using the packs of 21 pills. Risks associated with oral contraceptive pills Oral contraceptive pills can sometimes cause side effects, such as:  Headache.  Depression.  Trouble sleeping.  Nausea and vomiting.  Breast tenderness.  Irregular bleeding or spotting during the first several months.  Bloating or fluid retention.  Increase in blood pressure. Combination pills are also associated with a small increase in the risk of:  Blood clots.  Heart attack.  Stroke. Summary  Oral contraceptive pills are medicines taken by mouth to prevent pregnancy. They are highly effective when taken exactly as prescribed.  The most common types of birth control pills contain the hormones estrogen and progestin (synthetic progesterone) or progestin only.  Before you start taking the pill, you may have a physical exam, blood test, and Pap test. Your health care provider will make sure you are a good candidate for oral contraception.  The combination pill  may come in a 21-day pack, a 28-day pack, or a 91-day pack. The minipill contains the progesterone hormone only and comes in packs of 28 pills.  Oral contraceptive pills can sometimes cause side effects, such as headache, nausea, breast tenderness, or irregular bleeding. This information is not intended to replace advice given to you by your health care provider. Make sure you discuss any questions you have with your health care provider. Document Released: 07/19/2002 Document Revised: 04/10/2017 Document Reviewed: 07/22/2016 Elsevier Patient Education  2020 [employer]  mg of calcium (between diet and supplement) and 800 units of Vitamin D per day seems prudent. Certain women may benefit from higher intake of Vitamin D.  If you are among these women, your doctor will have told you during your visit.    PAP SMEARS:  Pap smears, to check for cervical cancer or precancers,  have traditionally been done yearly, although recent scientific advances have shown that most women can have pap smears less often.  However, every woman still should have a physical exam from her gynecologist every year. It will include a breast check, inspection of the vulva and vagina to check for abnormal growths or skin changes, a visual exam of the cervix, and then an exam to evaluate the size and shape  of the uterus and ovaries.  And after 22 years of age, a rectal exam is indicated to check for rectal cancers. We will also provide age appropriate advice regarding health maintenance, like when you should have certain vaccines, screening for sexually transmitted diseases, bone density testing, colonoscopy, mammograms, etc.   MAMMOGRAMS:  All women over 22 years old should have a yearly mammogram. Many facilities now offer a "3D" mammogram, which may cost around $50 extra out of pocket. If possible,  we recommend you accept the option to have the 3D mammogram performed.  It both reduces the number of women who will be called back  for extra views which then turn out to be normal, and it is better than the routine mammogram at detecting truly abnormal areas.    COLON CANCER SCREENING: Now recommend starting at age 22. At this time colonoscopy is not covered for routine screening until 50. There are take home tests that can be done between 45-49.   COLONOSCOPY:  Colonoscopy to screen for colon cancer is recommended for all women at age 22.  We know, you hate the idea of the prep.  We agree, BUT, having colon cancer and not knowing it is worse!!  Colon cancer so often starts as a polyp that can be seen and removed at colonscopy, which can quite literally save your life!  And if your first colonoscopy is normal and you have no family history of colon cancer, most women don't have to have it again for 10 years.  Once every ten years, you can do something that may end up saving your life, right?  We will be happy to help you get it scheduled when you are ready.  Be sure to check your insurance coverage so you understand how much it will cost.  It may be covered as a preventative service at no cost, but you should check your particular policy.      Breast Self-Awareness Breast self-awareness means being familiar with how your breasts look and feel. It involves checking your breasts regularly and reporting any changes to your health care provider. Practicing breast self-awareness is important. A change in your breasts can be a sign of a serious medical problem. Being familiar with how your breasts look and feel allows you to find any problems early, when treatment is more likely to be successful. All women should practice breast self-awareness, including women who have had breast implants. How to do a breast self-exam One way to learn what is normal for your breasts and whether your breasts are changing is to do a breast self-exam. To do a breast self-exam: Look for Changes  1. Remove all the clothing above your waist. 2. Stand in  front of a mirror in a room with good lighting. 3. Put your hands on your hips. 4. Push your hands firmly downward. 5. Compare your breasts in the mirror. Look for differences between them (asymmetry), such as: ? Differences in shape. ? Differences in size. ? Puckers, dips, and bumps in one breast and not the other. 6. Look at each breast for changes in your skin, such as: ? Redness. ? Scaly areas. 7. Look for changes in your nipples, such as: ? Discharge. ? Bleeding. ? Dimpling. ? Redness. ? A change in position. Feel for Changes Carefully feel your breasts for lumps and changes. It is best to do this while lying on your back on the floor and again while sitting or standing in the shower or tub  with soapy water on your skin. Feel each breast in the following way:  Place the arm on the side of the breast you are examining above your head.  Feel your breast with the other hand.  Start in the nipple area and make  inch (2 cm) overlapping circles to feel your breast. Use the pads of your three middle fingers to do this. Apply light pressure, then medium pressure, then firm pressure. The light pressure will allow you to feel the tissue closest to the skin. The medium pressure will allow you to feel the tissue that is a little deeper. The firm pressure will allow you to feel the tissue close to the ribs.  Continue the overlapping circles, moving downward over the breast until you feel your ribs below your breast.  Move one finger-width toward the center of the body. Continue to use the  inch (2 cm) overlapping circles to feel your breast as you move slowly up toward your collarbone.  Continue the up and down exam using all three pressures until you reach your armpit.  Write Down What You Find  Write down what is normal for each breast and any changes that you find. Keep a written record with breast changes or normal findings for each breast. By writing this information down, you do not  need to depend only on memory for size, tenderness, or location. Write down where you are in your menstrual cycle, if you are still menstruating. If you are having trouble noticing differences in your breasts, do not get discouraged. With time you will become more familiar with the variations in your breasts and more comfortable with the exam. How often should I examine my breasts? Examine your breasts every month. If you are breastfeeding, the best time to examine your breasts is after a feeding or after using a breast pump. If you menstruate, the best time to examine your breasts is 5-7 days after your period is over. During your period, your breasts are lumpier, and it may be more difficult to notice changes. When should I see my health care provider? See your health care provider if you notice:  A change in shape or size of your breasts or nipples.  A change in the skin of your breast or nipples, such as a reddened or scaly area.  Unusual discharge from your nipples.  A lump or thick area that was not there before.  Pain in your breasts.  Anything that concerns you.

## 2019-01-01 LAB — RPR: RPR Ser Ql: NONREACTIVE

## 2019-01-01 LAB — LIPID PANEL
Chol/HDL Ratio: 2.3 ratio (ref 0.0–4.4)
Cholesterol, Total: 160 mg/dL (ref 100–199)
HDL: 69 mg/dL (ref >39)
LDL Calculated: 78 mg/dL (ref 0–99)
Triglycerides: 64 mg/dL (ref 0–149)
VLDL Cholesterol Cal: 13 mg/dL (ref 5–40)

## 2019-01-01 LAB — COMPREHENSIVE METABOLIC PANEL
ALT: 10 IU/L (ref 0–32)
AST: 15 IU/L (ref 0–40)
Albumin/Globulin Ratio: 2.1 (ref 1.2–2.2)
Albumin: 4.5 g/dL (ref 3.9–5.0)
Alkaline Phosphatase: 58 IU/L (ref 39–117)
BUN/Creatinine Ratio: 12 (ref 9–23)
BUN: 9 mg/dL (ref 6–20)
Bilirubin Total: 0.9 mg/dL (ref 0.0–1.2)
CO2: 22 mmol/L (ref 20–29)
Calcium: 8.9 mg/dL (ref 8.7–10.2)
Chloride: 106 mmol/L (ref 96–106)
Creatinine, Ser: 0.76 mg/dL (ref 0.57–1.00)
GFR calc Af Amer: 130 mL/min/{1.73_m2} (ref >59)
GFR calc non Af Amer: 112 mL/min/{1.73_m2} (ref >59)
Globulin, Total: 2.1 g/dL (ref 1.5–4.5)
Glucose: 98 mg/dL (ref 65–99)
Potassium: 3.9 mmol/L (ref 3.5–5.2)
Sodium: 141 mmol/L (ref 134–144)
Total Protein: 6.6 g/dL (ref 6.0–8.5)

## 2019-01-01 LAB — CBC
Hematocrit: 39.3 % (ref 34.0–46.6)
Hemoglobin: 13.1 g/dL (ref 11.1–15.9)
MCH: 30.2 pg (ref 26.6–33.0)
MCHC: 33.3 g/dL (ref 31.5–35.7)
MCV: 91 fL (ref 79–97)
Platelets: 284 10*3/uL (ref 150–450)
RBC: 4.34 x10E6/uL (ref 3.77–5.28)
RDW: 12.3 % (ref 11.7–15.4)
WBC: 6.7 10*3/uL (ref 3.4–10.8)

## 2019-01-01 LAB — VAGINITIS/VAGINOSIS, DNA PROBE
Candida Species: NEGATIVE
Gardnerella vaginalis: POSITIVE — AB
Trichomonas vaginosis: NEGATIVE

## 2019-01-01 LAB — HIV ANTIBODY (ROUTINE TESTING W REFLEX): HIV Screen 4th Generation wRfx: NONREACTIVE

## 2019-01-03 ENCOUNTER — Telehealth: Payer: Self-pay

## 2019-01-03 MED ORDER — METRONIDAZOLE 500 MG PO TABS: 500.0000 mg | ORAL_TABLET | Freq: Two times a day (BID) | ORAL | 0 refills | Status: DC

## 2019-01-03 NOTE — Telephone Encounter (Signed)
Spoke with patient. Results given. Rx for Flagyl 500 mg BID x 7 days #14 0 RF sent to pharmacy on file. Avoid alcohol during treatment and 24 hours after completing medication. Don't mix with alcohol if mixed can cause severe nausea, vomiting and abdominal cramping. Patient verbalizes understanding. Aware she will be contacted with remaining results when they have returned. Encounter closed.

## 2019-01-03 NOTE — Telephone Encounter (Signed)
-----   Message from Salvadore Dom, MD sent at 01/03/2019  9:07 AM EDT ----- Please inform the patient that her vaginitis probe was + for BV and treat with flagyl (either oral or vaginal, her choice), no ETOH while on Flagyl. BV could be contributing to her pain with bleeding.  Oral: Flagyl 500 mg BID x 7 days, or Vaginal: Metrogel, 1 applicator per vagina q day x 5 days. Other blood work is normal. Pap and cervical cultures are pending.

## 2019-01-04 LAB — CYTOLOGY - PAP
Chlamydia: NEGATIVE
Diagnosis: NEGATIVE
Neisseria Gonorrhea: NEGATIVE

## 2019-02-02 DIAGNOSIS — N941 Unspecified dyspareunia: Secondary | ICD-10-CM | POA: Diagnosis not present

## 2019-02-02 DIAGNOSIS — N94819 Vulvodynia, unspecified: Secondary | ICD-10-CM | POA: Diagnosis not present

## 2019-02-02 DIAGNOSIS — M62838 Other muscle spasm: Secondary | ICD-10-CM | POA: Diagnosis not present

## 2019-02-02 DIAGNOSIS — R102 Pelvic and perineal pain: Secondary | ICD-10-CM | POA: Diagnosis not present

## 2019-02-02 DIAGNOSIS — M6281 Muscle weakness (generalized): Secondary | ICD-10-CM | POA: Diagnosis not present

## 2019-02-10 DIAGNOSIS — N94819 Vulvodynia, unspecified: Secondary | ICD-10-CM | POA: Diagnosis not present

## 2019-02-10 DIAGNOSIS — N941 Unspecified dyspareunia: Secondary | ICD-10-CM | POA: Diagnosis not present

## 2019-02-10 DIAGNOSIS — M62838 Other muscle spasm: Secondary | ICD-10-CM | POA: Diagnosis not present

## 2019-02-10 DIAGNOSIS — R102 Pelvic and perineal pain: Secondary | ICD-10-CM | POA: Diagnosis not present

## 2019-02-10 DIAGNOSIS — M6281 Muscle weakness (generalized): Secondary | ICD-10-CM | POA: Diagnosis not present

## 2019-07-01 ENCOUNTER — Ambulatory Visit: Payer: No Typology Code available for payment source | Admitting: Family Medicine

## 2019-07-02 DIAGNOSIS — Z111 Encounter for screening for respiratory tuberculosis: Secondary | ICD-10-CM | POA: Diagnosis not present

## 2019-07-05 DIAGNOSIS — Z111 Encounter for screening for respiratory tuberculosis: Secondary | ICD-10-CM | POA: Diagnosis not present

## 2020-01-09 ENCOUNTER — Encounter: Payer: Self-pay | Admitting: Family Medicine

## 2020-01-09 ENCOUNTER — Other Ambulatory Visit: Payer: Self-pay | Admitting: Obstetrics and Gynecology

## 2020-03-02 ENCOUNTER — Ambulatory Visit: Payer: No Typology Code available for payment source | Admitting: Family Medicine

## 2020-03-05 ENCOUNTER — Ambulatory Visit: Payer: No Typology Code available for payment source | Admitting: Family Medicine

## 2020-06-26 ENCOUNTER — Other Ambulatory Visit: Payer: Self-pay | Admitting: Family Medicine

## 2020-06-26 DIAGNOSIS — J45990 Exercise induced bronchospasm: Secondary | ICD-10-CM

## 2020-09-08 ENCOUNTER — Other Ambulatory Visit: Payer: Self-pay | Admitting: Family Medicine

## 2020-09-08 DIAGNOSIS — J45990 Exercise induced bronchospasm: Secondary | ICD-10-CM

## 2021-01-11 ENCOUNTER — Ambulatory Visit: Payer: Self-pay | Admitting: Family Medicine

## 2021-01-25 ENCOUNTER — Ambulatory Visit: Payer: Self-pay | Admitting: Family Medicine

## 2021-01-25 ENCOUNTER — Ambulatory Visit: Payer: 59 | Admitting: Obstetrics and Gynecology

## 2021-02-01 ENCOUNTER — Ambulatory Visit: Payer: Self-pay | Admitting: Family Medicine

## 2021-02-06 ENCOUNTER — Ambulatory Visit: Payer: 59 | Admitting: Obstetrics and Gynecology

## 2021-02-15 ENCOUNTER — Encounter: Payer: Self-pay | Admitting: Obstetrics and Gynecology

## 2021-02-15 ENCOUNTER — Ambulatory Visit: Payer: Managed Care, Other (non HMO) | Admitting: Obstetrics and Gynecology

## 2021-02-15 ENCOUNTER — Other Ambulatory Visit: Payer: Self-pay

## 2021-02-15 VITALS — BP 116/74 | Ht 70.0 in | Wt 184.0 lb

## 2021-02-15 DIAGNOSIS — N915 Oligomenorrhea, unspecified: Secondary | ICD-10-CM | POA: Diagnosis not present

## 2021-02-15 DIAGNOSIS — Z113 Encounter for screening for infections with a predominantly sexual mode of transmission: Secondary | ICD-10-CM | POA: Diagnosis not present

## 2021-02-15 DIAGNOSIS — Z8619 Personal history of other infectious and parasitic diseases: Secondary | ICD-10-CM

## 2021-02-15 DIAGNOSIS — Z3169 Encounter for other general counseling and advice on procreation: Secondary | ICD-10-CM

## 2021-02-15 DIAGNOSIS — L709 Acne, unspecified: Secondary | ICD-10-CM

## 2021-02-15 LAB — PREGNANCY, URINE: Preg Test, Ur: NEGATIVE

## 2021-02-15 NOTE — Progress Notes (Signed)
GYNECOLOGY  VISIT   HPI: 25 y.o.   Married White or Caucasian Not Hispanic or Latino  female   [id number] with Patient's last menstrual period was 01/26/2021.   here for STD screen and Infertility consult   She has been having unprotected intercourse for one year without conception. Cycles every 24-38 days. She has had a couple of skipped cycles in the last year. Typically bleeds x 5 days, she can saturate a pad in one to two hours.  Prior to her September cycles she had 2-3 days of heavy, dark spotting a few days prior to her cycle.  No thyroid c/o, no galactorrhea. No hirsutism, some acne on her back.   H/O chlamydia, found on routine screening.   Husband is 9, overweight but otherwise healthy. Non smoker, no marijuana. No meds.   GYNECOLOGIC HISTORY: Patient's last menstrual period was 01/26/2021. Contraception:none Menopausal hormone therapy: N/A        OB History     Gravida  0   Para  0   Term  0   Preterm  0   AB  0   Living  0      SAB  0   IAB  0   Ectopic  0   Multiple  0   Live Births  0              Patient Active Problem List   Diagnosis Date Noted   History of chlamydia    Asthma, exercise induced 09/07/2018   Depression, major, recurrent (HCC) 01/20/2017   Anorexia nervosa in remission 01/20/2017    Past Medical History:  Diagnosis Date   Allergy    Anxiety    Asthma    Chicken pox    Depression    Eating disorder    History of chlamydia    Stroke (HCC)    Related to head trauma at age 80    Past Surgical History:  Procedure Laterality Date   TONSILLECTOMY AND ADENOIDECTOMY     WISDOM TOOTH EXTRACTION      Current Outpatient Medications  Medication Sig Dispense Refill   albuterol (VENTOLIN HFA) 108 (90 Base) MCG/ACT inhaler INHALE 2 PUFFS INTO THE LUNGS EVERY 6 HOURS AS NEEDED FOR WHEEZING OR SHORTNESS OF BREATH 54 g 0   lidocaine (XYLOCAINE) 5 % ointment Apply topically 20 minutes prior to intercourse, wipe off just prior  to intercourse. (Patient not taking: Reported on 02/15/2021) 50 g 1   No current facility-administered medications for this visit.     ALLERGIES: Other  Family History  Problem Relation Age of Onset   Depression Mother    Anxiety disorder Mother    Alcohol abuse Mother    Seizures Father    Testicular cancer Father    Cancer Maternal Uncle        Lung   Alcohol abuse Maternal Grandmother    Cancer Maternal Grandmother        colon   Depression Sister    Anxiety disorder Sister     Social History   Socioeconomic History   Marital status: Married    Spouse name: Not on file   Number of children: Not on file   Years of education: Not on file   Highest education level: Not on file  Occupational History   Not on file  Tobacco Use   Smoking status: Never   Smokeless tobacco: Never  Vaping Use   Vaping Use: Never used  Substance and Sexual Activity  Alcohol use: Yes    Comment: seldom   Drug use: Never   Sexual activity: Yes    Birth control/protection: None  Other Topics Concern   Not on file  Social History Narrative   Not on file   Social Determinants of Health   Financial Resource Strain: Not on file  Food Insecurity: Not on file  Transportation Needs: Not on file  Physical Activity: Not on file  Stress: Not on file  Social Connections: Not on file  Intimate Partner Violence: Not on file    ROS  PHYSICAL EXAMINATION:    BP 116/74 (Cuff Size: Normal)   Ht 5\' 10"  (1.778 m)   Wt 184 lb (83.5 kg)   LMP 01/26/2021   BMI 26.40 kg/m     General appearance: alert, cooperative and appears stated age Neck: no adenopathy, supple, symmetrical, trachea midline and thyroid normal to inspection and palpation  Pelvic: External genitalia:  no lesions              Urethra:  normal appearing urethra with no masses, tenderness or lesions              Bartholins and Skenes: normal                 Vagina: normal appearing vagina with normal color and discharge, no  lesions              Cervix: no lesions              Bimanual Exam:  Uterus:  normal size, contour, position, consistency, mobility, non-tender              Adnexa: no mass, fullness, tenderness               Chaperone was present for exam.  1. Infertility counseling Patient with irregular cycles, h/o chlamydia - Progesterone -She will call with her cycle to set up an HSG, information given. Will pre-treat with doxycycline   2. Oligomenorrhea, unspecified type - Progesterone - TSH - Prolactin - Testosterone, Total, LC/MS/MS - Pregnancy, urine: negative  3. Acne, unspecified acne type Oligomenorrhea, acne, BMI 26, possible PCOS - Testosterone, Total, LC/MS/MS  4. Screening examination for STD (sexually transmitted disease) Declines blood work - SURESWAB CT/NG/T. vaginalis  5. History of chlamydia Asymptomatic. Will check HSG (pretreat with doxy)

## 2021-02-15 NOTE — Patient Instructions (Addendum)
Female Infertility Female infertility refers to a woman's inability to get pregnant (conceive) after a year of having sex regularly (or after 6 months in women over age 24) without using birth control. Infertility can also mean that a woman is not able to carry a pregnancy to full term. Both women and men can experience fertility problems. What are the causes? This condition may be caused by: Problems with reproductive organs. Infertility can result if a woman: Is not ovulating or is ovulating irregularly. Has a blockage or scarring in the fallopian tubes. Has uterine fibroids. This is a benign mass of tissue or muscle (tumor) that can develop in the uterus. Has an abnormally shaped uterus. Has an abnormally short cervix or a cervix that does not remain closed during a pregnancy. Certain medical conditions. These may include: Polycystic ovary syndrome (PCOS). This is a hormonal disorder that can cause small cysts to grow on the ovaries. This is the most common cause of infertility in women. Endometriosis. This is a condition in which the tissue that lines the uterus (endometrium) grows outside of its normal location. Cancer and cancer treatments, such as chemotherapy or radiation. Premature ovarian failure. This is when ovaries stop producing eggs and hormones before age 83. Sexually transmitted infections, such as chlamydia or gonorrhea. Autoimmune disorders. These are disorders in which the body's defense system (immune system) attacks normal, healthy cells. Infertility can be linked to more than one cause. For some women, the cause of infertility is not known (unexplained infertility). What increases the risk? The following factors may make you more likely to develop this condition: Age. A woman's fertility declines with age, especially after her mid-38s. Stress. Smoking. Being underweight or overweight. Drinking too much alcohol. Using drugs such as anabolic steroids, cocaine, and  marijuana. Exercising excessively. What are the signs or symptoms? The main sign of infertility in women is the inability to get pregnant or carry a pregnancy to full term. How is this diagnosed? This condition may be diagnosed by: Checking whether you are ovulating each month. The tests may include: Blood tests to check hormone levels. An ultrasound of the ovaries. Taking a sample of the tissue that lines the uterus and checking it under a microscope (endometrial biopsy). Doing additional tests. This is done if ovulation is normal. Tests may include: Hysterosalpingogram. This X-ray test can show the shape of the uterus and whether the fallopian tubes are open. Laparoscopy. This test uses a lighted tube (laparoscope) to look for problems in the fallopian tubes and other organs. Transvaginal ultrasound. This imaging test is used to check for abnormalities in the uterus and ovaries. Hysteroscopy. This test uses a lighted tube to check for problems in the cervix and the uterus. To be diagnosed with infertility, both partners will have a physical exam. Both partners will also have an extensive medical and sexual history taken. Additional tests may be done. How is this treated? Treatment depends on the cause of infertility. Most cases of infertility in women are treated with medicine or surgery. Women may take medicine to: Correct ovulation problems. Treat other health conditions. Surgery may be done to: Repair damage to the ovaries, fallopian tubes, cervix, or uterus. Remove growths from the uterus. Remove scar tissue from the uterus, pelvis, or other organs. Assisted reproductive technology (ART) Assisted reproductive technology (ART) refers to all treatments and procedures that combine eggs and sperm outside the body to try to help a couple conceive. ART is often combined with fertility drugs to stimulate ovulation.  Sometimes ART is done using eggs retrieved from another woman's body (donor  eggs) or from previously frozen fertilized eggs (embryos). There are different types of ART. These include: Intrauterine insemination (IUI). A long, thin tube is used to place sperm directly into a woman's uterus. This procedure: Is effective for infertility caused by sperm problems, including low sperm count and low motility. Can be used in combination with fertility drugs. In vitro fertilization (IVF). This is done when a woman's fallopian tubes are blocked or when a man has low sperm count. In this procedure: Fertility drugs are used to stimulate the ovaries to produce multiple eggs. Once mature, these eggs are removed from the body and combined with the sperm to be fertilized. The fertilized eggs are then placed into the woman's uterus. Follow these instructions at home: Lifestyle If you drink alcohol, limit how much you have to 0-1 drink a day. Do not use any products that contain nicotine or tobacco. These products include cigarettes, chewing tobacco, and vaping devices, such as e-cigarettes. If you need help quitting, ask your health care provider. Practice stress reduction techniques that work well for you, such as regular physical activity, meditation, or deep breathing. Make dietary changes to lose weight or maintain a healthy weight. Work with your health care provider and a dietitian to set a weight-loss goal that is healthy and reasonable for you. General instructions Take over-the-counter and prescription medicines only as told by your health care provider. Seek support from a counselor or support group to talk about your concerns related to infertility. Couples counseling may be helpful for you and your partner. Keep all follow-up visits. This is important. Where to find support Resolve - The National Infertility Association: resolve.org Contact a health care provider if: You feel that stress is interfering with your life and relationships. You have side effects from treatments  for infertility. Summary Female infertility refers to a woman's inability to get pregnant (conceive) after a year of having sex regularly (or after 6 months in women over age 47) without using birth control. To be diagnosed with infertility, both partners will have a physical exam. Both partners will also have an extensive medical and sexual history taken. Seek support from a counselor or support group to talk about your concerns related to infertility. Couples counseling may be helpful for you and your partner. This information is not intended to replace advice given to you by your health care provider. Make sure you discuss any questions you have with your health care provider. Document Revised: 06/26/2020 Document Reviewed: 06/26/2020 Elsevier Patient Education  2022 Else[employer] Inc. Hysterosalpingography Hysterosalpingography is a procedure to look inside a woman's womb (uterus) and fallopian tubes. During this procedure, contrast dye is injected into the uterus through the vagina and cervix. X-rays are then taken. The dye makes the uterus and fallopian tubes show up clearly on the X-rays and may show whether the tubes are blocked, scarred, or if there are any other abnormalities. This procedure may be done: To determine if there are tumors, scars, or other abnormalities in the uterus. To determine why a woman is unable to get pregnant or has had repeated pregnancy losses. To make sure the fallopian tubes are completely blocked a few months after having certain tubal sterilization procedures. Tell a health care provider about: Any allergies you have. All medicines you are taking, including vitamins, herbs, eye drops, creams, and over-the-counter medicines. Any problems you or family members have had with anesthetic medicines. Any blood disorders  you have. Any surgeries you have had. Any medical conditions you have. Whether you are pregnant or may be pregnant. Whether you have been diagnosed  with an STI (sexually transmitted infection) or you think you have an STI. What are the risks? Generally, this is a safe procedure. However, problems may occur, including: Infection in the lining of the uterus or fallopian tubes. Allergic reaction to medicines or dyes. A hole (perforation) in the uterus or fallopian tubes. Damage to other structures or organs. What happens before the procedure? Medicines Ask your health care provider about: Changing or stopping your regular medicines. This is especially important if you are taking diabetes medicines or blood thinners. Taking medicines such as aspirin and ibuprofen. These medicines can thin your blood. Do not take these medicines unless your health care provider tells you to take them. Taking over-the-counter medicines, vitamins, herbs, and supplements. General instructions Schedule the procedure after your menstrual period stops, but before your next ovulation. This is usually between day 5 and day 10 of your last period. Day 1 is the first day of your period. Plan to have a responsible adult take you home from the hospital or clinic. Plan to have a responsible adult care for you for the time you are told after you leave the hospital or clinic. This is important. Empty your bladder before the procedure begins. What happens during the procedure? You may be given: A medicine to help you relax (sedative). A medicine to numb the area (local anesthetic). An over-the-counter pain medicine. You will lie down on your back and place your feet into footrests (stirrups). A device called a speculum will be inserted into your vagina. This allows your health care provider to see inside your vagina through to your cervix. Your cervix will be washed with a germ-killing soap. A medicine may be injected into your cervix to numb it. A thin, flexible tube will be passed through your cervix into your uterus. Contrast dye will be passed through the tube and  into the uterus. This may cause cramping. Several X-rays will be taken as the contrast dye spreads through the uterus and into the fallopian tubes. You may be asked to your change position and roll from side to side if needed. The tube will be removed. The contrast dye will flow out through your vagina. The procedure may vary among health care providers and hospitals. What can I expect after procedure? You may have mild cramping and vaginal bleeding. This should go away after a short time. Most of the contrast dye will flow out of your vagina. This fluid will be sticky and may have blood in it. You may want to wear a sanitary pad. Do not use a tampon. You have mild dizziness or nausea. Follow these instructions at home: Do not douche, use tampons, or have sex until your health care provider approves. Do not take baths, swim, or use a hot tub until your health care provider approves. Take showers instead of baths for 2 weeks, or for as long as told by your health care provider. If you were given a sedative during the procedure, it can affect you for several hours. Do not drive or operate machinery until your health care provider says that it is safe. Take over-the-counter and prescription medicines only as told by your health care provider. It is up to you to get the results of your procedure. Ask your health care provider, or the department that is doing the procedure, when your results  will be ready. Keep all follow-up visits. This is important. Contact a health care provider if: You have a fever or chills. You faint. You have severe cramping. Your skin has a rash, and is itchy or swollen. You have a bad-smelling vaginal discharge. You have vaginal bleeding that lasts for more than 4 days. Get help right away if: You have nausea and vomiting. You have heavy vaginal bleeding that soaks more than one pad every hour. You have severe abdominal pain. Summary Hysterosalpingography is a  procedure in which a woman's uterus and fallopian tubes are examined. During this procedure, contrast dye is injected into the uterus through the vagina and cervix. X-rays are then taken. The dye helps the uterus and fallopian tubes show up clearly on the X-rays. Schedule the procedure after your menstrual period stops, but before your next ovulation. This is usually between day 5 and day 10 of your last period. After the procedure, you may have mild cramping and vaginal bleeding. This should go away after a short time. Do not douche, use tampons, or have sex until your health care provider approves. This information is not intended to replace advice given to you by your health care provider. Make sure you discuss any questions you have with your health care provider. Document Revised: 12/28/2019 Document Reviewed: 12/14/2019 Elsevier Patient Education  2022 [employer].

## 2021-02-17 LAB — PROGESTERONE: Progesterone: 1.6 ng/mL

## 2021-02-17 LAB — TSH: TSH: 1.55 mIU/L

## 2021-02-17 LAB — PROLACTIN: Prolactin: 11.9 ng/mL

## 2021-02-17 LAB — TESTOSTERONE, TOTAL, LC/MS/MS: Testosterone, Total, LC-MS-MS: 60 ng/dL — ABNORMAL HIGH (ref 2–45)

## 2021-02-18 ENCOUNTER — Encounter: Payer: Self-pay | Admitting: Obstetrics and Gynecology

## 2021-02-18 DIAGNOSIS — E282 Polycystic ovarian syndrome: Secondary | ICD-10-CM

## 2021-02-18 HISTORY — DX: Polycystic ovarian syndrome: E28.2

## 2021-02-18 LAB — SURESWAB CT/NG/T. VAGINALIS
C. trachomatis RNA, TMA: NOT DETECTED
N. gonorrhoeae RNA, TMA: NOT DETECTED
Trichomonas vaginalis RNA: NOT DETECTED

## 2021-02-22 ENCOUNTER — Encounter: Payer: Self-pay | Admitting: Obstetrics and Gynecology

## 2021-04-15 ENCOUNTER — Encounter: Payer: Self-pay | Admitting: Obstetrics and Gynecology

## 2021-05-12 NOTE — L&D Delivery Note (Addendum)
Vaginal Delivery Note  SVD viable female infant Apgars 8+9 over second degree perineal laceration.  Placenta delivered spontaneously intact with 3VC. Repair with 2-0 Vicryl with good support and hemostasis noted.  Fundus firm with bimanual massage and pitocin.  Post delivery, found to be febrile and infant also febrile. Will treat for chorio. Single doses of ampicillin and gentamicin to be given.  Mother and baby to couplet care and are doing well.  EBL 255ccs  Hurshel Party, MD

## 2021-07-05 ENCOUNTER — Encounter: Payer: Self-pay | Admitting: Family

## 2021-07-05 ENCOUNTER — Ambulatory Visit: Payer: Self-pay | Admitting: Nurse Practitioner

## 2021-07-05 ENCOUNTER — Other Ambulatory Visit: Payer: Self-pay

## 2021-07-05 ENCOUNTER — Ambulatory Visit: Payer: Managed Care, Other (non HMO) | Admitting: Family

## 2021-07-05 VITALS — BP 112/80 | HR 73 | Temp 98.2°F | Ht 70.0 in | Wt 173.2 lb

## 2021-07-05 DIAGNOSIS — J45909 Unspecified asthma, uncomplicated: Secondary | ICD-10-CM | POA: Insufficient documentation

## 2021-07-05 DIAGNOSIS — Z3169 Encounter for other general counseling and advice on procreation: Secondary | ICD-10-CM | POA: Diagnosis not present

## 2021-07-05 DIAGNOSIS — J4520 Mild intermittent asthma, uncomplicated: Secondary | ICD-10-CM

## 2021-07-05 MED ORDER — ALBUTEROL SULFATE HFA 108 (90 BASE) MCG/ACT IN AERS: 2.0000 | INHALATION_SPRAY | Freq: Four times a day (QID) | RESPIRATORY_TRACT | 3 refills | Status: AC | PRN

## 2021-07-05 NOTE — Patient Instructions (Signed)
Welcome to [facility]! It was a pleasure meeting you today.  As discussed, I have sent your Albuterol inhaler to your pharmacy. Please schedule a 6 month follow up visit today.  Good luck with your pregnancy journey!   PLEASE NOTE:  If you had any LAB tests please let us know if you have not heard back within a few days. You may see your results on MyChart before we have a chance to review them but we will give you a call once they are reviewed by [place]. If we ordered any REFERRALS today, please let us know if you have not heard from their office within the next week.  Let us know through MyChart if you are needing REFILLS, or have your pharmacy send [place] the request. You can also use MyChart to communicate with me or any office staff.  Please try these tips to maintain a healthy lifestyle:  Eat most of your calories during the day when you are active. Eliminate processed foods including packaged sweets (pies, cakes, cookies), reduce intake of potatoes, white bread, white pasta, and white rice. Look for whole grain options, oat flour or almond flour.  Each meal should contain half fruits/vegetables, one quarter protein, and one quarter carbs (no bigger than a computer mouse).  Cut down on sweet beverages. This includes juice, soda, and sweet tea. Also watch fruit intake, though this is a healthier sweet option, it still contains natural sugar! Limit to 3 servings daily.  Drink at least 1 glass of water with each meal and aim for at least 8 glasses per day  Exercise at least 150 minutes every week.

## 2021-07-05 NOTE — Assessment & Plan Note (Signed)
Chronic - exercise, colder weather, and allergy season induced. Sx are managed with Albuterol inhaler prn.

## 2021-07-05 NOTE — Progress Notes (Signed)
New Patient Office Visit  Subjective:  Patient ID: Amy Odom, female    DOB: [birth year]  Age: 26 y.o. MRN: [id number]  CC:  Chief Complaint  Patient presents with   Establish Care   Asthma   Fertility    Pt says that she has been trying to get pregnant for 4 years, and has been unsuccessful.     HPI [name] presents for establishing care and to discuss one problem.  Asthma: Patient presents for asthma follow-up. She is not currently in exacerbation. Symptoms currently include dyspnea, non-productive cough, and wheezing and occur monthly.  Observed precipitants include exercise, infection, and pollens.  Current limitations in activity from asthma:  none .  Number of days of school or work missed in the last month: 0. Frequency of use of quick-relief meds: intermittent. The patient reports adherence to this regimen  Infertility: Patient presents for evaluation of infertility. Patient and partner have been attempting conception for 4 years. Marital Status: married for 5 years. Pregnancies with current partner:none. Pt reports regular menses lasting between 4-7 days most cycles. Denies any significant weight change, no androgenic sx. Last PAP wnl, denies pelvic or suprapubic pain, no previous STDs, cancer, or thyroid problems. Sister with PCOS. pt reports low labido for last several years. Also reports she has recently established with GYN for this issue, but is wanting to know more if possible.  Past Medical History:  Diagnosis Date   Allergy    Anxiety    Asthma    Chicken pox    Depression    Eating disorder    History of chlamydia    PCOS (polycystic ovarian syndrome) 02/18/2021   Stroke (HCC)    Related to head trauma at age 41    Past Surgical History:  Procedure Laterality Date   TONSILLECTOMY AND ADENOIDECTOMY     WISDOM TOOTH EXTRACTION      Family History  Problem Relation Age of Onset   Depression Mother    Anxiety disorder Mother    Alcohol abuse Mother     Seizures Father    Testicular cancer Father    Cancer Maternal Uncle        Lung   Alcohol abuse Maternal Grandmother    Cancer Maternal Grandmother        colon   Depression Sister    Anxiety disorder Sister     Social History   Socioeconomic History   Marital status: Married    Spouse name: Not on file   Number of children: Not on file   Years of education: Not on file   Highest education level: Not on file  Occupational History   Not on file  Tobacco Use   Smoking status: Never   Smokeless tobacco: Never  Vaping Use   Vaping Use: Never used  Substance and Sexual Activity   Alcohol use: Yes    Alcohol/week: 2.0 standard drinks    Types: 2 Glasses of wine per week    Comment: seldom   Drug use: Never   Sexual activity: Yes    Birth control/protection: None  Other Topics Concern   Not on file  Social History Narrative   Not on file   Social Determinants of Health   Financial Resource Strain: Not on file  Food Insecurity: Not on file  Transportation Needs: Not on file  Physical Activity: Not on file  Stress: Not on file  Social Connections: Not on file  Intimate Partner Violence: Not  on file    Objective:   Today's Vitals: BP 112/80    Pulse 73    Temp 98.2 F (36.8 C) (Temporal)    Ht 5\' 10"  (1.778 m)    Wt 173 lb 3.2 oz (78.6 kg)    LMP 06/02/2021 (Exact Date)    SpO2 100%    BMI 24.85 kg/m   Physical Exam Vitals and nursing note reviewed.  Constitutional:      Appearance: Normal appearance.  Cardiovascular:     Rate and Rhythm: Normal rate and regular rhythm.  Pulmonary:     Effort: Pulmonary effort is normal.     Breath sounds: Normal breath sounds.  Musculoskeletal:        General: Normal range of motion.  Skin:    General: Skin is warm and dry.  Neurological:     Mental Status: She is alert.  Psychiatric:        Mood and Affect: Mood normal.        Behavior: Behavior normal.    Assessment & Plan:   Problem List Items Addressed  This Visit       Respiratory   Extrinsic asthma - Primary    Chronic - exercise, colder weather, and allergy season induced. Sx are managed with Albuterol inhaler prn.       Relevant Medications   albuterol (VENTOLIN HFA) 108 (90 Base) MCG/ACT inhaler   Other Visit Diagnoses     Infertility counseling      pt reports trying to conceive for 4 years, states her menses are regular, she has tried to time with her ovulation period, no concerning medical hx, sister does have PCOS, also reports low labido for last few years. Pt asking about Metformin as she is established with GYN and they are thinking she also has PCOS. Advised pt Metformin is useful in regulating cycles and stimulating ovulation, usually start on low dose and gradually increase as it does affect BS, pt will discuss further with GYN.    Outpatient Encounter Medications as of 07/05/2021  Medication Sig   albuterol (VENTOLIN HFA) 108 (90 Base) MCG/ACT inhaler Inhale 2 puffs into the lungs every 6 (six) hours as needed for wheezing or shortness of breath.   [DISCONTINUED] albuterol (VENTOLIN HFA) 108 (90 Base) MCG/ACT inhaler INHALE 2 PUFFS INTO THE LUNGS EVERY 6 HOURS AS NEEDED FOR WHEEZING OR SHORTNESS OF BREATH   lidocaine (XYLOCAINE) 5 % ointment Apply topically 20 minutes prior to intercourse, wipe off just prior to intercourse. (Patient not taking: Reported on 02/15/2021)   No facility-administered encounter medications on file as of 07/05/2021.    Follow-up: Return for any future concerns.   07/07/2021, NP

## 2021-07-08 NOTE — Progress Notes (Deleted)
26 y.o. G0P0000 Married White or Caucasian Not Hispanic or Latino female here for annual exam.   ?  ? ?No LMP recorded.          ?Sexually active: {yes no:314532}  ?The current method of family planning is {contraception:315051}.    ?Exercising: {yes no:314532}  {types:19826} ?Smoker:  {YES NO:22349} ? ?Health Maintenance: ?Pap:  12/31/18 WNL  ?History of abnormal Pap:  no ?MMG:  none  ?BMD:   none  ?Colonoscopy: none  ?TDaP:  11/04/17 ?Gardasil: complete  ? ? reports that she has never smoked. She has never used smokeless tobacco. She reports current alcohol use of about 2.0 standard drinks per week. She reports that she does not use drugs. ? ?Past Medical History:  ?Diagnosis Date  ? Allergy   ? Anxiety   ? Asthma   ? Chicken pox   ? Depression   ? Eating disorder   ? History of chlamydia   ? PCOS (polycystic ovarian syndrome) 02/18/2021  ? Stroke [facility])   ? Related to head trauma at age 19  ? ? ?Past Surgical History:  ?Procedure Laterality Date  ? TONSILLECTOMY AND ADENOIDECTOMY    ? WISDOM TOOTH EXTRACTION    ? ? ?Current Outpatient Medications  ?Medication Sig Dispense Refill  ? albuterol (VENTOLIN HFA) 108 (90 Base) MCG/ACT inhaler Inhale 2 puffs into the lungs every 6 (six) hours as needed for wheezing or shortness of breath. 8 g 3  ? lidocaine (XYLOCAINE) 5 % ointment Apply topically 20 minutes prior to intercourse, wipe off just prior to intercourse. (Patient not taking: Reported on 02/15/2021) 50 g 1  ? ?No current facility-administered medications for this visit.  ? ? ?Family History  ?Problem Relation Age of Onset  ? Depression Mother   ? Anxiety disorder Mother   ? Alcohol abuse Mother   ? Seizures Father   ? Testicular cancer Father   ? Depression Sister   ? Anxiety disorder Sister   ? Cancer Maternal Uncle   ?     Lung  ? Alcohol abuse Maternal Grandmother   ? Cancer Maternal Grandmother   ?     colon  ? Diabetes Paternal Grandfather   ? ? ?Review of Systems ? ?Exam:   ?There were no vitals taken for this  visit.  Weight change: @WEIGHTCHANGE @ Height:      ?Ht Readings from Last 3 Encounters:  ?07/05/21 5\' 10"  (1.778 m)  ?02/15/21 5\' 10"  (1.778 m)  ?12/31/18 5' 9.25" (1.759 m)  ? ? ?General appearance: alert, cooperative and appears stated age ?Head: Normocephalic, without obvious abnormality, atraumatic ?Neck: no adenopathy, supple, symmetrical, trachea midline and thyroid {CHL AMB PHY EX THYROID NORM [phone]::"normal to inspection and palpation"} ?Lungs: clear to auscultation bilaterally ?Cardiovascular: regular rate and rhythm ?Breasts: {Exam; breast:13139::"normal appearance, no masses or tenderness"} ?Abdomen: soft, non-tender; non distended,  no masses,  no organomegaly ?Extremities: extremities normal, atraumatic, no cyanosis or edema ?Skin: Skin color, texture, turgor normal. No rashes or lesions ?Lymph nodes: Cervical, supraclavicular, and axillary nodes normal. ?No abnormal inguinal nodes palpated ?Neurologic: Grossly normal ? ? ?Pelvic: External genitalia:  no lesions ?             Urethra:  normal appearing urethra with no masses, tenderness or lesions ?             Bartholins and Skenes: normal    ?             Vagina: normal appearing vagina with  normal color and discharge, no lesions ?             Cervix: {CHL AMB PHY EX CERVIX NORM [phone]::"no lesions"} ?              ?Bimanual Exam:  Uterus:  {CHL AMB PHY EX UTERUS NORM DEFAULT:[phone]::"normal size, contour, position, consistency, mobility, non-tender"} ?             Adnexa: {CHL AMB PHY EX ADNEXA NO MASS DEFAULT:[phone]::"no mass, fullness, tenderness"} ?              Rectovaginal: Confirms ?              Anus:  normal sphincter tone, no lesions ? ?*** chaperoned for the exam. ? ?A:  Well Woman with normal exam ? ?P:    ? ? ? ?

## 2021-07-12 ENCOUNTER — Ambulatory Visit: Payer: 59 | Admitting: Family

## 2021-07-16 ENCOUNTER — Ambulatory Visit: Payer: Managed Care, Other (non HMO) | Admitting: Obstetrics and Gynecology

## 2021-08-01 ENCOUNTER — Ambulatory Visit: Payer: Managed Care, Other (non HMO) | Admitting: Obstetrics and Gynecology

## 2021-08-02 ENCOUNTER — Ambulatory Visit: Payer: Self-pay | Admitting: Nurse Practitioner

## 2021-08-05 ENCOUNTER — Ambulatory Visit: Payer: Managed Care, Other (non HMO) | Admitting: Obstetrics and Gynecology

## 2021-08-05 ENCOUNTER — Other Ambulatory Visit: Payer: Self-pay

## 2021-08-05 ENCOUNTER — Encounter: Payer: Self-pay | Admitting: Obstetrics and Gynecology

## 2021-08-05 VITALS — BP 110/72 | HR 77 | Ht 70.0 in | Wt 176.0 lb

## 2021-08-05 DIAGNOSIS — N926 Irregular menstruation, unspecified: Secondary | ICD-10-CM

## 2021-08-05 DIAGNOSIS — Z8619 Personal history of other infectious and parasitic diseases: Secondary | ICD-10-CM

## 2021-08-05 DIAGNOSIS — Z3201 Encounter for pregnancy test, result positive: Secondary | ICD-10-CM | POA: Diagnosis not present

## 2021-08-05 LAB — PREGNANCY, URINE: Preg Test, Ur: POSITIVE — AB

## 2021-08-05 NOTE — Patient Instructions (Signed)
Commonly Asked Questions During Pregnancy ? ?Cats: A parasite can be excreted in cat feces.  To avoid exposure you need to have another person empty the little box.  If you must empty the litter box you will need to wear gloves.  Wash your hands after handling your cat.  This parasite can also be found in raw or undercooked meat so this should also be avoided. ? ?Colds, Sore Throats, Flu: Please check your medication sheet to see what you can take for symptoms.  If your symptoms are unrelieved by these medications please call the office. ? ?Dental Work: Most any dental work your dentist recommends is permitted.  X-rays should only be taken during the first trimester if absolutely necessary.  Your abdomen should be shielded with a lead apron during all x-rays.  Please notify your provider prior to receiving any x-rays.  Novocaine is fine; gas is not recommended.  If your dentist requires a note from us prior to dental work please call the office and we will provide one for you. ? ?Exercise: Exercise is an important part of staying healthy during your pregnancy.  You may continue most exercises you were accustomed to prior to pregnancy.  Later in your pregnancy you will most likely notice you have difficulty with activities requiring balance like riding a bicycle.  It is important that you listen to your body and avoid activities that put you at a higher risk of falling.  Adequate rest and staying well hydrated are a must!  If you have questions about the safety of specific activities ask your provider.   ? ?Exposure to Children with illness: Try to avoid obvious exposure; report any symptoms to us when noted,  If you have chicken pos, red measles or mumps, you should be immune to these diseases.   Please do not take any vaccines while pregnant unless you have checked with your OB provider. ? ?Fetal Movement: After 28 weeks we recommend you do "kick counts" twice daily.  Lie or sit down in a calm quiet environment and  count your baby movements "kicks".  You should feel your baby at least 10 times per hour.  If you have not felt 10 kicks within the first hour get up, walk around and have something sweet to eat or drink then repeat for an additional hour.  If count remains less than 10 per hour notify your provider. ? ?Fumigating: Follow your pest control agent's advice as to how long to stay out of your home.  Ventilate the area well before re-entering. ? ?Hemorrhoids:   Most over-the-counter preparations can be used during pregnancy.  Check your medication to see what is safe to use.  It is important to use a stool softener or fiber in your diet and to drink lots of liquids.  If hemorrhoids seem to be getting worse please call the office.  ? ?Hot Tubs:  Hot tubs Jacuzzis and saunas are not recommended while pregnant.  These increase your internal body temperature and should be avoided. ? ?Intercourse:  Sexual intercourse is safe during pregnancy as long as you are comfortable, unless otherwise advised by your provider.  Spotting may occur after intercourse; report any bright red bleeding that is heavier than spotting. ? ?Labor:  If you know that you are in labor, please go to the hospital.  If you are unsure, please call the office and let us help you decide what to do. ? ?Lifting, straining, etc:  If your job requires heavy   lifting or straining please check with your provider for any limitations.  Generally, you should not lift items heavier than that you can lift simply with your hands and arms (no back muscles) ? ?Painting:  Paint fumes do not harm your pregnancy, but may make you ill and should be avoided if possible.  Latex or water based paints have less odor than oils.  Use adequate ventilation while painting. ? ?Permanents & Hair Color:  Chemicals in hair dyes are not recommended as they cause increase hair dryness which can increase hair loss during pregnancy.  " Highlighting" and permanents are allowed.  Dye may be  absorbed differently and permanents may not hold as well during pregnancy. ? ?Sunbathing:  Use a sunscreen, as skin burns easily during pregnancy.  Drink plenty of fluids; avoid over heating. ? ?Tanning Beds:  Because their possible side effects are still unknown, tanning beds are not recommended. ? ?Ultrasound Scans:  Routine ultrasounds are performed at approximately 20 weeks.  You will be able to see your baby's general anatomy an if you would like to know the gender this can usually be determined as well.  If it is questionable when you conceived you may also receive an ultrasound early in your pregnancy for dating purposes.  Otherwise ultrasound exams are not routinely performed unless there is a medical necessity.  Although you can request a scan we ask that you pay for it when conducted because insurance does not cover " patient request" scans. ? ?Work: If your pregnancy proceeds without complications you may work until your due date, unless your physician or employer advises otherwise. ? ?Round Ligament Pain/Pelvic Discomfort:  Sharp, shooting pains not associated with bleeding are fairly common, usually occurring in the second trimester of pregnancy.  They tend to be worse when standing up or when you remain standing for long periods of time.  These are the result of pressure of certain pelvic ligaments called "round ligaments".  Rest, Tylenol and heat seem to be the most effective relief.  As the womb and fetus grow, they rise out of the pelvis and the discomfort improves.  Please notify the office if your pain seems different than that described.  It may represent a more serious condition. ? ?Common Medications Safe in Pregnancy ? ?Acne:      Constipation: ? Benzoyl Peroxide     Colace ? Clindamycin      Dulcolax Suppository ? Topica Erythromycin     Fibercon ? Salicylic Acid      Metamucil ?        Miralax ?AVOID:        Senakot  ? Accutane    Cough: ? Retin-A       Cough  Drops ? Tetracycline      Phenergan w/ Codeine if Rx ? Minocycline      Robitussin (Plain & DM) ? ?Antibiotics:     Crabs/Lice: ? Ceclor       RID ? Cephalosporins    AVOID: ? E-Mycins      Kwell ? Keflex ? Macrobid/Macrodantin   Diarrhea: ? Penicillin      Kao-Pectate ? Zithromax      Imodium AD ?        PUSH FLUIDS ?AVOID:      ? Cipro     Fever: ? Tetracycline      Tylenol (Regular or Extra ? Minocycline       Strength) ? Levaquin      Extra Strength-Do not            Exceed 8 tabs/24 hrs ?Caffeine:       ? <200mg/day (equiv. To 1 cup of coffee or ? approx. 3 12 oz sodas)   ?      Gas: ?Cold/Hayfever:       Gas-X ? Benadryl      Mylicon ? Claritin       Phazyme ? **Claritin-D       ? Chlor-Trimeton    Headaches: ? Dimetapp      ASA-Free Excedrin ? Drixoral-Non-Drowsy     Cold Compress ? Mucinex (Guaifenasin)     Tylenol (Regular or Extra ? Sudafed/Sudafed-12 Hour     Strength) ? **Sudafed PE Pseudoephedrine  ? Tylenol Cold & Sinus    ? Vicks Vapor Rub ? Zyrtec ? ?**AVOID if Problems With Blood Pressure ? ? ? ? ? ? ? ? ?Heartburn: Avoid lying down for at least 1 hour after meals ? Aciphex     ? Maalox     Rash: ? Milk of Magnesia     Benadryl   ? Mylanta       1% Hydrocortisone Cream ? Pepcid ? Pepcid Complete   Sleep Aids: ? Prevacid      Ambien  ? Prilosec       Benadryl ? Rolaids       Chamomile Tea ? Tums (Limit 4/day)     Unisom ?        Tylenol PM ?        Warm milk-add vanilla or  ?Hemorrhoids:       Sugar for taste ? Anusol/Anusol H.C. ? (RX: Analapram 2.5%)  Sugar Substitutes: ? Hydrocortisone OTC     Ok in moderation ? Preparation H     ? Tucks       ? Vaseline lotion applied to tissue with wiping   ? ?Herpes:     Throat: ? Acyclovir      Oragel ? Famvir ? Valtrex     Vaccines: ?        Flu Shot ?Leg Cramps:       *Gardasil ? Benadryl      Hepatitis A ?        Hepatitis B ?Nasal Spray:       Pneumovax ? Saline Nasal Spray     Polio Booster ?        Tetanus ?Nausea:       Tuberculosis test or PPD ? Vitamin  B6 25 mg TID   AVOID:   ? Dramamine      *Gardasil ? Emetrol       Live Poliovirus ? Ginger Root 250 mg QID    MMR (measles, mumps & ? High Complex Carbs @ Bedtime    rebella) ? Sea Bands-Accupressure    Varicella (Chickenpox) ? Unisom

## 2021-08-05 NOTE — Progress Notes (Signed)
GYNECOLOGY  VISIT ?  ?HPI: ?26 y.o.   Married White or Caucasian Not Hispanic or Latino  female   ?G0P0000 with Patient's last menstrual period was 06/03/2021 (exact date).   ?here for pregnancy conformation  ? ?She has a h/o irregular cycles, every 24-38 days. She had a + UPT around 07/10/21. No spotting. She is having breast tenderness, nausea, extreme fatigue, back pain. No abdominal pain. ? ?She is on PNV.  ? ?H/O chlamydia.  ? ?GYNECOLOGIC HISTORY: ?Patient's last menstrual period was 06/03/2021 (exact date). ?Contraception:none  ?Menopausal hormone therapy: none  ?       ?OB History   ? ? Gravida  ?0  ? Para  ?0  ? Term  ?0  ? Preterm  ?0  ? AB  ?0  ? Living  ?0  ?  ? ? SAB  ?0  ? IAB  ?0  ? Ectopic  ?0  ? Multiple  ?0  ? Live Births  ?0  ?   ?  ?  ?    ? ?Patient Active Problem List  ? Diagnosis Date Noted  ? Extrinsic asthma 07/05/2021  ? PCOS (polycystic ovarian syndrome) 02/18/2021  ? History of chlamydia   ? Asthma, exercise induced 09/07/2018  ? Depression, major, recurrent (HCC) 01/20/2017  ? Anorexia nervosa in remission 01/20/2017  ? ? ?Past Medical History:  ?Diagnosis Date  ? Allergy   ? Anxiety   ? Asthma   ? Chicken pox   ? Depression   ? Eating disorder   ? History of chlamydia   ? PCOS (polycystic ovarian syndrome) 02/18/2021  ? Stroke [facility])   ? Related to head trauma at age 89  ? ? ?Past Surgical History:  ?Procedure Laterality Date  ? TONSILLECTOMY AND ADENOIDECTOMY    ? WISDOM TOOTH EXTRACTION    ? ? ?Current Outpatient Medications  ?Medication Sig Dispense Refill  ? albuterol (VENTOLIN HFA) 108 (90 Base) MCG/ACT inhaler Inhale 2 puffs into the lungs every 6 (six) hours as needed for wheezing or shortness of breath. 8 g 3  ? ?No current facility-administered medications for this visit.  ?  ? ?ALLERGIES: Other ? ?Family History  ?Problem Relation Age of Onset  ? Depression Mother   ? Anxiety disorder Mother   ? Alcohol abuse Mother   ? Seizures Father   ? Testicular cancer Father   ? Depression  Sister   ? Anxiety disorder Sister   ? Cancer Maternal Uncle   ?     Lung  ? Alcohol abuse Maternal Grandmother   ? Cancer Maternal Grandmother   ?     colon  ? Diabetes Paternal Grandfather   ? ? ?Social History  ? ?Socioeconomic History  ? Marital status: Married  ?  Spouse name: Not on file  ? Number of children: Not on file  ? Years of education: Not on file  ? Highest education level: Not on file  ?Occupational History  ? Not on file  ?Tobacco Use  ? Smoking status: Never  ? Smokeless tobacco: Never  ?Vaping Use  ? Vaping Use: Never used  ?Substance and Sexual Activity  ? Alcohol use: Yes  ?  Alcohol/week: 2.0 standard drinks  ?  Types: 2 Glasses of wine per week  ?  Comment: seldom  ? Drug use: Never  ? Sexual activity: Yes  ?  Birth control/protection: None  ?Other Topics Concern  ? Not on file  ?Social History Narrative  ? Not  on file  ? ?Social Determinants of Health  ? ?Financial Resource Strain: Not on file  ?Food Insecurity: Not on file  ?Transportation Needs: Not on file  ?Physical Activity: Not on file  ?Stress: Not on file  ?Social Connections: Not on file  ?Intimate Partner Violence: Not on file  ? ? ?Review of Systems  ?All other systems reviewed and are negative. ? ?PHYSICAL EXAMINATION:   ? ?BP 110/72   Pulse 77   Ht 5\' 10"  (1.778 m)   Wt 176 lb (79.8 kg)   LMP 06/03/2021 (Exact Date)   SpO2 99%   BMI 25.25 kg/m?     ?General appearance: alert, cooperative and appears stated age ? ?1. Missed menses ?- Pregnancy, urine: + ? ?2. Positive pregnancy test ?Risk factors for ectopic ?- 06/05/2021 OB Transvaginal; Future ?-On PNV ?-Given names of OB's ?-Given information on common questions in pregnancy and safe medications in pregnancy ? ?3. History of chlamydia ?- US OB Transvaginal; Future ? ?

## 2021-08-06 ENCOUNTER — Ambulatory Visit: Payer: Managed Care, Other (non HMO)

## 2021-08-06 DIAGNOSIS — Z331 Pregnant state, incidental: Secondary | ICD-10-CM

## 2021-08-06 DIAGNOSIS — Z8619 Personal history of other infectious and parasitic diseases: Secondary | ICD-10-CM

## 2021-08-06 DIAGNOSIS — Z3201 Encounter for pregnancy test, result positive: Secondary | ICD-10-CM | POA: Diagnosis not present

## 2021-08-07 ENCOUNTER — Telehealth: Payer: Self-pay | Admitting: Obstetrics and Gynecology

## 2021-08-07 NOTE — Telephone Encounter (Signed)
Please let the patient know that I reviewed her ultrasound images and everything looks great! Please give her my congratulations again.  ?If she has any issues prior to establishing care with OB, she should call.  ?

## 2021-08-07 NOTE — Telephone Encounter (Signed)
Left detailed message on patient voicemail per DPR access.  

## 2021-08-30 LAB — OB RESULTS CONSOLE RPR: RPR: NONREACTIVE

## 2021-08-30 LAB — HEPATITIS C ANTIBODY: HCV Ab: NEGATIVE

## 2021-08-30 LAB — OB RESULTS CONSOLE RUBELLA ANTIBODY, IGM: Rubella: IMMUNE

## 2021-08-30 LAB — OB RESULTS CONSOLE HIV ANTIBODY (ROUTINE TESTING): HIV: NONREACTIVE

## 2021-08-30 LAB — OB RESULTS CONSOLE HEPATITIS B SURFACE ANTIGEN: Hepatitis B Surface Ag: NEGATIVE

## 2021-09-12 LAB — OB RESULTS CONSOLE GC/CHLAMYDIA
Chlamydia: NEGATIVE
Neisseria Gonorrhea: NEGATIVE

## 2022-02-25 ENCOUNTER — Inpatient Hospital Stay (HOSPITAL_COMMUNITY)
Admission: AD | Admit: 2022-02-25 | Discharge: 2022-02-25 | Disposition: A | Discharge status: Home / Self Care | Payer: Managed Care, Other (non HMO) | Attending: Obstetrics and Gynecology | Admitting: Obstetrics and Gynecology

## 2022-02-25 ENCOUNTER — Encounter (HOSPITAL_COMMUNITY): Payer: Self-pay | Admitting: *Deleted

## 2022-02-25 DIAGNOSIS — Z3A38 38 weeks gestation of pregnancy: Secondary | ICD-10-CM

## 2022-02-25 DIAGNOSIS — Z3689 Encounter for other specified antenatal screening: Secondary | ICD-10-CM

## 2022-02-25 DIAGNOSIS — O479 False labor, unspecified: Secondary | ICD-10-CM | POA: Diagnosis not present

## 2022-02-25 DIAGNOSIS — O471 False labor at or after 37 completed weeks of gestation: Secondary | ICD-10-CM | POA: Insufficient documentation

## 2022-02-25 NOTE — MAU Provider Note (Signed)
Per RN: Amy Odom is a 26 y.o. G1P0000 at [redacted]w[redacted]d  who presents to MAU today complaining of regular contractions since last night. No LOF. Has some pink mucous discharge. +FM.   O: BP 122/74   Pulse 89   Temp 98 F (36.7 C)   Resp 18   Ht 5\' 10"  (1.778 m)   Wt 99.3 kg   LMP 06/03/2021 (Exact Date)   SpO2 99%   BMI 31.42 kg/m   Cervical exam:  Dilation: 2.5 Effacement (%): 70 Cervical Position: Posterior Station: -3 Presentation: Vertex Exam by:: Youlanda Roys, RN  Fetal Monitoring: Baseline: 125 Variability: mod Accelerations: + Decelerations: no Contractions: irreg   A: SIUP at [redacted]w[redacted]d  False labor Reactive NST  P: Discharge home Follow up @[facility] as scheduled Labor precautions FMCs   Julianne Handler, CNM 02/25/2022 11:44 PM

## 2022-02-25 NOTE — MAU Note (Signed)
.  Amy Odom is a 26 y.o. at [redacted]w[redacted]d here in MAU reporting ctxs since last night but stronger tonight. Some pink mucous d/c. Good FM. Was 2cm last Friday.   Onset of complaint: last night Pain score: 6 Vitals:   02/25/22 2120 02/25/22 2121  BP:  126/76  Pulse:  (!) 105  Resp: 18   Temp: 98 F (36.7 C)   SpO2: 100%      FHT:127 Lab orders placed from triage:  mau labor eval

## 2022-02-26 ENCOUNTER — Inpatient Hospital Stay (HOSPITAL_COMMUNITY): Payer: Managed Care, Other (non HMO) | Admitting: Anesthesiology

## 2022-02-26 ENCOUNTER — Other Ambulatory Visit: Payer: Self-pay

## 2022-02-26 ENCOUNTER — Inpatient Hospital Stay (HOSPITAL_COMMUNITY)
Admission: AD | Admit: 2022-02-26 | Discharge: 2022-02-28 | DRG: 805 | Disposition: A | Discharge status: Home / Self Care | Payer: Managed Care, Other (non HMO) | Attending: Obstetrics and Gynecology | Admitting: Obstetrics and Gynecology

## 2022-02-26 ENCOUNTER — Encounter (HOSPITAL_COMMUNITY): Payer: Self-pay | Admitting: Obstetrics and Gynecology

## 2022-02-26 DIAGNOSIS — O411230 Chorioamnionitis, third trimester, not applicable or unspecified: Secondary | ICD-10-CM | POA: Diagnosis present

## 2022-02-26 DIAGNOSIS — Z23 Encounter for immunization: Secondary | ICD-10-CM | POA: Diagnosis not present

## 2022-02-26 DIAGNOSIS — O99284 Endocrine, nutritional and metabolic diseases complicating childbirth: Principal | ICD-10-CM | POA: Diagnosis present

## 2022-02-26 DIAGNOSIS — Z3A38 38 weeks gestation of pregnancy: Principal | ICD-10-CM

## 2022-02-26 DIAGNOSIS — O99214 Obesity complicating childbirth: Secondary | ICD-10-CM | POA: Diagnosis present

## 2022-02-26 DIAGNOSIS — J45909 Unspecified asthma, uncomplicated: Secondary | ICD-10-CM | POA: Diagnosis present

## 2022-02-26 DIAGNOSIS — E282 Polycystic ovarian syndrome: Secondary | ICD-10-CM | POA: Diagnosis present

## 2022-02-26 LAB — CBC
HCT: 35.5 % — ABNORMAL LOW (ref 36.0–46.0)
Hemoglobin: 12.5 g/dL (ref 12.0–15.0)
MCH: 31.3 pg (ref 26.0–34.0)
MCHC: 35.2 g/dL (ref 30.0–36.0)
MCV: 89 fL (ref 80.0–100.0)
Platelets: 239 10*3/uL (ref 150–400)
RBC: 3.99 MIL/uL (ref 3.87–5.11)
RDW: 12.8 % (ref 11.5–15.5)
WBC: 15.6 10*3/uL — ABNORMAL HIGH (ref 4.0–10.5)
nRBC: 0 % (ref 0.0–0.2)

## 2022-02-26 LAB — TYPE AND SCREEN
ABO/RH(D): A POS
Antibody Screen: NEGATIVE

## 2022-02-26 LAB — OB RESULTS CONSOLE GBS: GBS: NEGATIVE

## 2022-02-26 LAB — POCT FERN TEST: POCT Fern Test: POSITIVE

## 2022-02-26 MED ORDER — TETANUS-DIPHTH-ACELL PERTUSSIS 5-2.5-18.5 LF-MCG/0.5 IM SUSY
0.5000 mL | PREFILLED_SYRINGE | Freq: Once | INTRAMUSCULAR | Status: AC
  Administered 2022-02-28: 0.5 mL via INTRAMUSCULAR
  Filled 2022-02-26: qty 0.5

## 2022-02-26 MED ORDER — PHENYLEPHRINE 80 MCG/ML (10ML) SYRINGE FOR IV PUSH (FOR BLOOD PRESSURE SUPPORT): 80.0000 ug | PREFILLED_SYRINGE | INTRAVENOUS | Status: DC | PRN

## 2022-02-26 MED ORDER — SOD CITRATE-CITRIC ACID 500-334 MG/5ML PO SOLN: 30.0000 mL | ORAL | Status: DC | PRN

## 2022-02-26 MED ORDER — PRENATAL MULTIVITAMIN CH
1.0000 | ORAL_TABLET | Freq: Every day | ORAL | Status: DC
  Administered 2022-02-27: 1 via ORAL
  Filled 2022-02-26: qty 1

## 2022-02-26 MED ORDER — ONDANSETRON HCL 4 MG PO TABS: 4.0000 mg | ORAL_TABLET | ORAL | Status: DC | PRN

## 2022-02-26 MED ORDER — GENTAMICIN SULFATE 40 MG/ML IJ SOLN
5.0000 mg/kg | INTRAVENOUS | Status: DC
  Administered 2022-02-26: 400 mg via INTRAVENOUS
  Filled 2022-02-26 (×2): qty 10

## 2022-02-26 MED ORDER — ZOLPIDEM TARTRATE 5 MG PO TABS: 5.0000 mg | ORAL_TABLET | Freq: Every evening | ORAL | Status: DC | PRN

## 2022-02-26 MED ORDER — DIPHENHYDRAMINE HCL 25 MG PO CAPS: 25.0000 mg | ORAL_CAPSULE | Freq: Four times a day (QID) | ORAL | Status: DC | PRN

## 2022-02-26 MED ORDER — HYDROXYZINE HCL 50 MG PO TABS: 50.0000 mg | ORAL_TABLET | Freq: Four times a day (QID) | ORAL | Status: DC | PRN

## 2022-02-26 MED ORDER — FENTANYL-BUPIVACAINE-NACL 0.5-0.125-0.9 MG/250ML-% EP SOLN
12.0000 mL/h | EPIDURAL | Status: DC | PRN
  Administered 2022-02-26: 12.5 mL/h via EPIDURAL
  Filled 2022-02-26: qty 250

## 2022-02-26 MED ORDER — DIBUCAINE (PERIANAL) 1 % EX OINT: 1.0000 | TOPICAL_OINTMENT | CUTANEOUS | Status: DC | PRN

## 2022-02-26 MED ORDER — OXYTOCIN BOLUS FROM INFUSION
333.0000 mL | Freq: Once | INTRAVENOUS | Status: AC
  Administered 2022-02-26: 333 mL via INTRAVENOUS

## 2022-02-26 MED ORDER — SODIUM CHLORIDE 0.9 % IV SOLN
2.0000 g | Freq: Once | INTRAVENOUS | Status: AC
  Administered 2022-02-26: 2 g via INTRAVENOUS
  Filled 2022-02-26: qty 2000

## 2022-02-26 MED ORDER — IBUPROFEN 600 MG PO TABS
600.0000 mg | ORAL_TABLET | Freq: Four times a day (QID) | ORAL | Status: DC
  Administered 2022-02-26 – 2022-02-28 (×6): 600 mg via ORAL
  Filled 2022-02-26 (×7): qty 1

## 2022-02-26 MED ORDER — DIPHENHYDRAMINE HCL 50 MG/ML IJ SOLN: 12.5000 mg | INTRAMUSCULAR | Status: DC | PRN

## 2022-02-26 MED ORDER — FENTANYL CITRATE (PF) 100 MCG/2ML IJ SOLN: 50.0000 ug | INTRAMUSCULAR | Status: DC | PRN

## 2022-02-26 MED ORDER — COCONUT OIL OIL: 1.0000 | TOPICAL_OIL | Status: DC | PRN

## 2022-02-26 MED ORDER — LACTATED RINGERS IV SOLN
500.0000 mL | Freq: Once | INTRAVENOUS | Status: AC
  Administered 2022-02-26: 500 mL via INTRAVENOUS

## 2022-02-26 MED ORDER — SENNOSIDES-DOCUSATE SODIUM 8.6-50 MG PO TABS
2.0000 | ORAL_TABLET | Freq: Every day | ORAL | Status: DC
  Filled 2022-02-26: qty 2

## 2022-02-26 MED ORDER — EPHEDRINE 5 MG/ML INJ: 10.0000 mg | INTRAVENOUS | Status: DC | PRN

## 2022-02-26 MED ORDER — SIMETHICONE 80 MG PO CHEW: 80.0000 mg | CHEWABLE_TABLET | ORAL | Status: DC | PRN

## 2022-02-26 MED ORDER — BENZOCAINE-MENTHOL 20-0.5 % EX AERO
1.0000 | INHALATION_SPRAY | CUTANEOUS | Status: DC | PRN
  Administered 2022-02-27: 1 via TOPICAL
  Filled 2022-02-26: qty 56

## 2022-02-26 MED ORDER — OXYTOCIN-SODIUM CHLORIDE 30-0.9 UT/500ML-% IV SOLN
2.5000 [IU]/h | INTRAVENOUS | Status: DC
  Filled 2022-02-26: qty 500

## 2022-02-26 MED ORDER — ACETAMINOPHEN 325 MG PO TABS
650.0000 mg | ORAL_TABLET | ORAL | Status: DC | PRN
  Administered 2022-02-26: 650 mg via ORAL
  Filled 2022-02-26: qty 2

## 2022-02-26 MED ORDER — WITCH HAZEL-GLYCERIN EX PADS: 1.0000 | MEDICATED_PAD | CUTANEOUS | Status: DC | PRN

## 2022-02-26 MED ORDER — ACETAMINOPHEN 325 MG PO TABS: 650.0000 mg | ORAL_TABLET | ORAL | Status: DC | PRN

## 2022-02-26 MED ORDER — LIDOCAINE HCL (PF) 1 % IJ SOLN: 30.0000 mL | INTRAMUSCULAR | Status: DC | PRN

## 2022-02-26 MED ORDER — ONDANSETRON HCL 4 MG/2ML IJ SOLN: 4.0000 mg | INTRAMUSCULAR | Status: DC | PRN

## 2022-02-26 MED ORDER — LACTATED RINGERS IV SOLN: INTRAVENOUS | Status: DC

## 2022-02-26 MED ORDER — ONDANSETRON HCL 4 MG/2ML IJ SOLN: 4.0000 mg | Freq: Four times a day (QID) | INTRAMUSCULAR | Status: DC | PRN

## 2022-02-26 MED ORDER — LACTATED RINGERS IV SOLN: 500.0000 mL | INTRAVENOUS | Status: DC | PRN

## 2022-02-26 MED ORDER — LIDOCAINE HCL (PF) 1 % IJ SOLN
INTRAMUSCULAR | Status: DC | PRN
  Administered 2022-02-26 (×2): 5 mL via EPIDURAL
  Administered 2022-02-26: 2 mL via EPIDURAL

## 2022-02-26 NOTE — Progress Notes (Signed)
MD at [facility], first push with minimal movement.  Small crown noted when pushing.  Coaching given.

## 2022-02-26 NOTE — Anesthesia Preprocedure Evaluation (Signed)
Anesthesia Evaluation  Patient identified by MRN, date of birth, ID band Patient awake    Reviewed: Allergy & Precautions, Patient's Chart, lab work & pertinent test results  Airway Mallampati: II  TM Distance: >3 FB Neck ROM: Full    Dental no notable dental hx. (+) Teeth Intact, Dental Advisory Given   Pulmonary asthma ,    Pulmonary exam normal breath sounds clear to auscultation       Cardiovascular negative cardio ROS Normal cardiovascular exam Rhythm:Regular Rate:Normal     Neuro/Psych PSYCHIATRIC DISORDERS Anxiety Depression Age 26 due to TBI CVA, No Residual Symptoms    GI/Hepatic Neg liver ROS, GERD  ,  Endo/Other  Obesity PCOS  Renal/GU negative Renal ROS  negative genitourinary   Musculoskeletal negative musculoskeletal ROS (+)   Abdominal (+) + obese,   Peds  Hematology negative hematology ROS (+)   Anesthesia Other Findings   Reproductive/Obstetrics (+) Pregnancy                             Anesthesia Physical Anesthesia Plan  ASA: 2  Anesthesia Plan: Epidural   Post-op Pain Management:    Induction:   PONV Risk Score and Plan: Treatment may vary due to age or medical condition  Airway Management Planned: Natural Airway  Additional Equipment: None  Intra-op Plan:   Post-operative Plan:   Informed Consent: I have reviewed the patients History and Physical, chart, labs and discussed the procedure including the risks, benefits and alternatives for the proposed anesthesia with the patient or authorized representative who has indicated his/her understanding and acceptance.       Plan Discussed with: Anesthesiologist  Anesthesia Plan Comments:         Anesthesia Quick Evaluation

## 2022-02-26 NOTE — Lactation Note (Signed)
This note was copied from a baby's chart. Lactation Consultation Note  Patient Name: Boy Bayleigh Loflin IHWTU'U Date: 02/26/2022 Reason for consult: L&D Initial assessment;Early term 37-38.6wks Age:25 hours   Initial L&D Consult:  Spoke with RN prior to entering room.  She reported that baby has had some increased WOB; she will further evaluate and call me back if it is a good time to assist with latching.  Will follow up on the M/B unit if she does not asked for assistance in labor and delivery.   Maternal Data    Feeding Mother's Current Feeding Choice: Breast Milk  LATCH Score                    Lactation Tools Discussed/Used    Interventions    Discharge    Consult Status Consult Status: Follow-up from L&D    Rishab Stoudt R Dewana Ammirati 02/26/2022, 5:13 PM

## 2022-02-26 NOTE — Progress Notes (Signed)
Pharmacy Antibiotic Note  Amy Odom is a 26 y.o. female admitted on 02/26/2022 with SROM at [redacted]w[redacted]d.  Pharmacy has been consulted for Gentamicin dosing for chorioamnionitis/ Triple I  Plan: Gentamicin 5mg /kg (400mg ) IV q24h Will continue to follow and assess further kinetic workup  Height: 5\' 10"  (177.8 cm) Weight: 99.3 kg (218 lb 14.7 oz) IBW/kg (Calculated) : 68.5 Adjusted/ Dosing BW: 80.8kg  Temp (24hrs), Avg:98.1 F (36.7 C), Min:97.6 F (36.4 C), Max:98.4 F (36.9 C)  Recent Labs  Lab 02/26/22 0751  WBC 15.6*    CrCl cannot be calculated (Patient's most recent lab result is older than the maximum 21 days allowed.).    Allergies  Allergen Reactions   Other Other (See Comments)    Seasonal allergies sneezing     Antimicrobials this admission: Ampicillin 2 gram IV q6h  10/18 >>  Thank you for allowing pharmacy to be a part of this patient's care.  Vernie Ammons 02/26/2022 4:26 PM

## 2022-02-26 NOTE — H&P (Addendum)
Amy Odom is a 26 y.o. G1P0 presenting with SROM. Pregnancy complicated by mixed anxiety and depressive disorder (on no meds currently), asthma, PCOS, resolved LLP.  OB History     Gravida  1   Para  0   Term  0   Preterm  0   AB  0   Living  0      SAB  0   IAB  0   Ectopic  0   Multiple  0   Live Births  0          Past Medical History:  Diagnosis Date   Allergy    Anxiety    Asthma    Chicken pox    Depression    Eating disorder    History of chlamydia    PCOS (polycystic ovarian syndrome) 02/18/2021   Stroke ([place])    Related to head trauma at age 69   Past Surgical History:  Procedure Laterality Date   TONSILLECTOMY AND ADENOIDECTOMY     WISDOM TOOTH EXTRACTION     Family History: family history includes Alcohol abuse in her maternal grandmother and mother; Anxiety disorder in her mother and sister; Cancer in her maternal grandmother and maternal uncle; Depression in her mother and sister; Diabetes in her paternal grandfather; Seizures in her father; Testicular cancer in her father. Social History:  reports that she has never smoked. She has never used smokeless tobacco. She reports that she does not currently use alcohol after a past usage of about 2.0 standard drinks of alcohol per week. She reports that she does not use drugs.     Maternal Diabetes: No Genetic Screening: Normal Maternal Ultrasounds/Referrals: Normal Fetal Ultrasounds or other Referrals:  None Maternal Substance Abuse:  No Significant Maternal Medications:  Meds include: Other: albuterol inhaler Significant Maternal Lab Results:  Group B Strep negative Number of Prenatal Visits:greater than 3 verified prenatal visits Other Comments:  None  Vitals:   02/26/22 0845 02/26/22 0846  BP: 117/72 117/64  Pulse: (!) 181 92  Resp:    Temp:    SpO2:      Vitals and nursing note reviewed. Exam conducted with a chaperone present.  Constitutional:      Appearance: Normal  appearance.  HENT:     Head: Normocephalic.  Eyes:     Pupils: Pupils are equal, round, and reactive to light.  Cardiovascular:     Rate and Rhythm: Normal rate and regular rhythm.     Pulses: Normal pulses.  Abdominal:     General: Abdomen is Gravid, nontender Neurological:     Mental Status: She is alert.   Exam per MAU Dilation: 3.5 Effacement (%): 80 Station: -3 Exam by:: jolynn Blood pressure 117/64, pulse 92, temperature 98.4 F (36.9 C), temperature source Oral, resp. rate (!) 22, height 5\' 10"  (1.778 m), weight 99.3 kg, last menstrual period 06/03/2021, SpO2 100 %.  5/80/-2  Prenatal labs: ABO, Rh: --/--/PENDING (10/18 0750) Antibody: PENDING (10/18 0750) Rubella:   RPR:    HBsAg:    HIV:    GBS:     Assessment/Plan: 26 yo G1P0 @ [redacted]w[redacted]d admitted due to SROM. S/p epidural - cervix now 5/80/-2. Contracting without pitocin, discussed poss pitocin augmentation if starts to space. Asthma - will avoid hemabate, rare inhaler use PCOS LLP - resolved spontaneously GBS neg. Anticipate NSVD.    Charlotta Newton 02/26/2022, 8:51 AM

## 2022-02-26 NOTE — MAU Note (Signed)
.  Amy Odom is a 26 y.o. at [redacted]w[redacted]d here in MAU reporting SROM at 0600 with clear fld. Ctxs are stronger and closer. Pt was here last night for labor eval and sent home. Was 2.5cm at d/c home at that time. Reports good FM  Onset of complaint: 0600 Pain score: 9 Vitals:   02/26/22 0656  BP: 120/74  Pulse: 80  Resp: 18  Temp: 97.6 F (36.4 C)  SpO2: 100%     FHT:146 Lab orders placed from triage:  mau labor eval

## 2022-02-26 NOTE — Progress Notes (Signed)
SVD baby boy skin to skin with mother 

## 2022-02-26 NOTE — Anesthesia Procedure Notes (Signed)
Epidural Patient location during procedure: OB Start time: 02/26/2022 8:33 AM End time: 02/26/2022 8:41 AM  Staffing Anesthesiologist: Josephine Igo, MD Performed: anesthesiologist   Preanesthetic Checklist Completed: patient identified, IV checked, site marked, risks and benefits discussed, surgical consent, monitors and equipment checked, pre-op evaluation and timeout performed  Epidural Patient position: sitting Prep: DuraPrep and site prepped and draped Patient monitoring: continuous pulse ox and blood pressure Approach: midline Location: L3-L4 Injection technique: LOR air  Needle:  Needle type: Tuohy  Needle gauge: 17 G Needle length: 9 cm and 9 Needle insertion depth: 5 cm Catheter type: closed end flexible Catheter size: 19 Gauge Catheter at skin depth: 10 cm Test dose: negative and Other  Assessment Events: blood not aspirated, injection not painful, no injection resistance, no paresthesia and negative IV test  Additional Notes Patient identified. Risks and benefits discussed including failed block, incomplete  Pain control, post dural puncture headache, nerve damage, paralysis, blood pressure Changes, nausea, vomiting, reactions to medications-both toxic and allergic and post Partum back pain. All questions were answered. Patient expressed understanding and wished to proceed. Sterile technique was used throughout procedure. Epidural site was Dressed with sterile barrier dressing. No paresthesias, signs of intravascular injection Or signs of intrathecal spread were encountered.  Patient was more comfortable after the epidural was dosed. Please see RN's note for documentation of vital signs and FHR which are stable. Reason for block:procedure for pain

## 2022-02-27 LAB — CBC
HCT: 30.3 % — ABNORMAL LOW (ref 36.0–46.0)
Hemoglobin: 10.7 g/dL — ABNORMAL LOW (ref 12.0–15.0)
MCH: 31.2 pg (ref 26.0–34.0)
MCHC: 35.3 g/dL (ref 30.0–36.0)
MCV: 88.3 fL (ref 80.0–100.0)
Platelets: 209 10*3/uL (ref 150–400)
RBC: 3.43 MIL/uL — ABNORMAL LOW (ref 3.87–5.11)
RDW: 13.2 % (ref 11.5–15.5)
WBC: 18.2 10*3/uL — ABNORMAL HIGH (ref 4.0–10.5)
nRBC: 0 % (ref 0.0–0.2)

## 2022-02-27 LAB — RPR: RPR Ser Ql: NONREACTIVE

## 2022-02-27 NOTE — Lactation Note (Signed)
This note was copied from a baby's chart. Lactation Consultation Note  Patient Name: Amy Odom [id number] Date: 02/27/2022 Reason for consult: Initial assessment;Primapara;Early term 37-38.6wks Age:25 hours Mom states baby has been BF well. Mom has flat/semi flat nipples. Discussed feeding positions, support, STS, I&O, newborn feeding habits, behavior. Mom encouraged to feed baby 8-12 times/24 hours and with feeding cues.   Strongly encouraged mom to wear shells in am and pre-pump before latching.  Answered questions dad had. Mom didn't ask questions. Encouraged to call for assistance .  Maternal Data Has patient been taught Hand Expression?:  (mom stated she knows how to hand express) Does the patient have breastfeeding experience prior to this delivery?: No  Feeding    LATCH Score Latch: Repeated attempts needed to sustain latch, nipple held in mouth throughout feeding, stimulation needed to elicit sucking reflex.  Audible Swallowing: None  Type of Nipple: Flat  Comfort (Breast/Nipple): Filling, red/small blisters or bruises, mild/mod discomfort (nipples look red)  Hold (Positioning): Assistance needed to correctly position infant at breast and maintain latch.  LATCH Score: 4   Lactation Tools Discussed/Used Tools: Shells;Pump Breast pump type: Manual Pump Education: Setup, frequency, and cleaning Reason for Pumping: flat Pumping frequency: pre-pumping  Interventions Interventions: Breast feeding basics reviewed;Adjust position;Assisted with latch;Support pillows;Skin to skin;Position options;Hand express;Breast compression;Hand pump;Shells;Pre-pump if needed;LC Services brochure  Discharge    Consult Status Consult Status: Follow-up Date: 02/27/22 Follow-up type: In-patient    Theodoro Kalata 02/27/2022, 1:43 AM

## 2022-02-27 NOTE — Social Work (Signed)
CSW received consult for hx of Anxiety and Depression.  CSW met with MOB to offer support and complete assessment. When CSW entered the room MOB was holding the infant and FOB was sitting in the chair. CSW introduced self, CSW role and reason for visit. MOB was agreeable to visit and allowed FOB to remain in the room. CSW inquired about how MOB was feeling, MOB reported good and reported her delivery was challenging.  CSW inquired about MOB's MH hx, MOB reported he was diagnosed with anxiety and depression when she was in 9th grade. CSW inquired about treatment, MOB reported she was on Lexapro and in therapy prior but none currently. CSW inquired about how MOB copes, MOB reported she has not had symptoms in the last couple years, MOB identified her anxiety and depression to be mild. CSW assessed for safety, MOB denied any SI or HI. MOB identified FOB and her parents as her supports. CSW provided education regarding the baby blues period vs. perinatal mood disorders, discussed treatment and gave resources for mental health follow up if concerns arise.  CSW recommends self-evaluation during the postpartum time period using the New Mom Checklist from Postpartum Progress and encouraged MOB to contact a medical professional if symptoms are noted at any time.    CSW provided review of Sudden Infant Death Syndrome (SIDS) precautions.  MOB identified [facility] for infants follow up care. MOB reported they have all necessary items for the infant including a bassinet for him to sleep. CSW identifies no further need for intervention and no barriers to discharge at this time.   Lander Eslick, LCSWA Clinical Social Worker [phone] 

## 2022-02-27 NOTE — Progress Notes (Addendum)
Post Partum Day 1 Subjective: no complaints, up ad lib, voiding, and tolerating PO  Objective: Blood pressure 111/77, pulse 74, temperature (!) 97.5 F (36.4 C), temperature source Oral, resp. rate 16, height 5\' 10"  (1.778 m), weight 99.3 kg, last menstrual period 06/03/2021, SpO2 97 %, unknown if currently breastfeeding.  Physical Exam:  General: alert Lochia: appropriate Uterine Fundus: firm Incision: N/A DVT Evaluation: No evidence of DVT seen on physical exam.  Recent Labs    02/26/22 0751 02/27/22 0601  HGB 12.5 10.7*  HCT 35.5* 30.3*    Assessment/Plan: Plan for discharge tomorrow, Breastfeeding, and Circumcision prior to discharge S/p tx for chorio with resolution. D/W patient female infant circumcision, risks/benefits reviewed. All questions answered.    LOS: 1 day   Tyson Dense, MD 02/27/2022, 8:32 AM

## 2022-02-27 NOTE — Anesthesia Postprocedure Evaluation (Signed)
Anesthesia Post Note  Patient: Amy Odom  Procedure(s) Performed: AN AD [place]     Patient location during evaluation: Mother Baby Anesthesia Type: Epidural Level of consciousness: awake Pain management: satisfactory to patient Vital Signs Assessment: post-procedure vital signs reviewed and stable Respiratory status: spontaneous breathing Cardiovascular status: stable Anesthetic complications: no   No notable events documented.  Last Vitals:  Vitals:   02/27/22 0014 02/27/22 0538  BP: 120/64 111/77  Pulse: 80 74  Resp: 18 16  Temp: 36.5 C (!) 36.4 C  SpO2:      Last Pain:  Vitals:   02/27/22 0538  TempSrc: Oral  PainSc:    Pain Goal: Patients Stated Pain Goal: 0 (02/26/22 0659)                 Casimer Lanius

## 2022-02-28 MED ORDER — IBUPROFEN 600 MG PO TABS: 600.0000 mg | ORAL_TABLET | Freq: Four times a day (QID) | ORAL | 0 refills | Status: AC | PRN

## 2022-02-28 MED ORDER — ACETAMINOPHEN 325 MG PO TABS: 650.0000 mg | ORAL_TABLET | Freq: Four times a day (QID) | ORAL | 3 refills | Status: AC | PRN

## 2022-02-28 NOTE — Discharge Summary (Signed)
   Postpartum Discharge Summary  Date of Service updated 02/28/22     Patient Name: Amy Odom DOB: 04/15/1996 MRN: [id number]  Date of admission: 02/26/2022 Delivery date:02/26/2022  Delivering provider: CHEN, JANE J  Date of discharge: 02/28/2022  Admitting diagnosis: [redacted] weeks gestation of pregnancy [Z3A.38] Intrauterine pregnancy: [redacted]w[redacted]d     Secondary diagnosis:  Principal Problem:   [redacted] weeks gestation of pregnancy  Additional problems:     Discharge diagnosis: Term Pregnancy Delivered                                              Post partum procedures: Augmentation:  Complications: post partum chorioamnionitis  Hospital course: Onset of Labor With Vaginal Delivery      26 y.o. yo G1P1001 at [redacted]w[redacted]d was admitted in Active Labor on 02/26/2022. Labor course was complicated by  Membrane Rupture Time/Date: 6:00 AM ,02/26/2022   Delivery Method:Vaginal, Spontaneous  Episiotomy: None  Lacerations:  2nd degree;Vaginal  Patient had a postpartum course complicated by chorioamnionitis.  She is ambulating, tolerating a regular diet, passing flatus, and urinating well. Patient is discharged home in stable condition on 02/28/22.  Newborn Data: Birth date:02/26/2022  Birth time:4:09 PM  Gender:Female  Living status:Living  Apgars:9 ,9  Weight:3530 g   Magnesium Sulfate received: No BMZ received: No Rhophylac:No MMR:No T-DaP:Given prenatally Flu: No Transfusion:No  Physical exam  Vitals:   02/27/22 0923 02/27/22 1505 02/27/22 1929 02/28/22 0517  BP: 115/68 (!) 116/53 123/76 120/78  Pulse: 74 69 72 86  Resp: 14 18 17 16  Temp: 97.7 F (36.5 C) 98.1 F (36.7 C) 98.4 F (36.9 C) 98 F (36.7 C)  TempSrc: Oral Oral Oral Oral  SpO2:      Weight:      Height:       General: alert, cooperative, and no distress Lochia: appropriate Uterine Fundus: firm Incision: Healing well with no significant drainage DVT Evaluation: No evidence of DVT seen on physical  exam. Labs: Lab Results  Component Value Date   WBC 18.2 (H) 02/27/2022   HGB 10.7 (L) 02/27/2022   HCT 30.3 (L) 02/27/2022   MCV 88.3 02/27/2022   PLT 209 02/27/2022      Latest Ref Rng & Units 12/31/2018    1:14 PM  CMP  Glucose 65 - 99 mg/dL 98   BUN 6 - 20 mg/dL 9   Creatinine 0.57 - 1.00 mg/dL 0.76   Sodium 134 - 144 mmol/L 141   Potassium 3.5 - 5.2 mmol/L 3.9   Chloride 96 - 106 mmol/L 106   CO2 20 - 29 mmol/L 22   Calcium 8.7 - 10.2 mg/dL 8.9   Total Protein 6.0 - 8.5 g/dL 6.6   Total Bilirubin 0.0 - 1.2 mg/dL 0.9   Alkaline Phos 39 - 117 IU/L 58   AST 0 - 40 IU/L 15   ALT 0 - 32 IU/L 10    Edinburgh Score:    02/26/2022    6:30 PM  Edinburgh Postnatal Depression Scale Screening Tool  I have been able to laugh and see the funny side of things. 0  I have looked forward with enjoyment to things. 0  I have blamed myself unnecessarily when things went wrong. 0  I have been anxious or worried for no good reason. 0  I have felt scared or panicky for no   good reason. 0  Things have been getting on top of me. 0  I have been so unhappy that I have had difficulty sleeping. 0  I have felt sad or miserable. 0  I have been so unhappy that I have been crying. 0  The thought of harming myself has occurred to me. 0  Edinburgh Postnatal Depression Scale Total 0      After visit meds:  Allergies as of 02/28/2022       Reactions   Other Other (See Comments)   Seasonal allergies sneezing        Medication List     TAKE these medications    acetaminophen 325 MG tablet Commonly known as: Tylenol Take 2 tablets (650 mg total) by mouth every 6 (six) hours as needed (for pain scale < 4).   albuterol 108 (90 Base) MCG/ACT inhaler Commonly known as: VENTOLIN HFA Inhale 2 puffs into the lungs every 6 (six) hours as needed for wheezing or shortness of breath.   ibuprofen 600 MG tablet Commonly known as: ADVIL Take 1 tablet (600 mg total) by mouth every 6 (six) hours  as needed.   prenatal multivitamin Tabs tablet Take 1 tablet by mouth daily at 12 noon.         Discharge home in stable condition Infant Feeding: Breast Infant Disposition:home with mother Discharge instruction: per After Visit Summary and Postpartum booklet. Activity: Advance as tolerated. Pelvic rest for 6 weeks.  Diet: routine diet Anticipated Birth Control: Unsure Postpartum Appointment:6 weeks Additional Postpartum F/U:  Future Appointments:No future appointments. Follow up Visit:      02/28/2022 James E Tomblin II, MD   

## 2022-03-12 ENCOUNTER — Telehealth (HOSPITAL_COMMUNITY): Payer: Self-pay | Admitting: *Deleted

## 2022-03-12 NOTE — Telephone Encounter (Signed)
Left phone voicemail message.  Odis Hollingshead, RN 03-12-2022 at 9:23am
